# Patient Record
Sex: Male | Born: 1960 | Race: Black or African American | Hispanic: No | Marital: Married | Smoking: Never smoker
Health system: Southern US, Community
[De-identification: ages and names within clinical notes are randomized; demographics above are authoritative.]

## PROBLEM LIST (undated history)

## (undated) DIAGNOSIS — R519 Headache, unspecified: Secondary | ICD-10-CM

## (undated) DIAGNOSIS — Z21 Asymptomatic human immunodeficiency virus [HIV] infection status: Secondary | ICD-10-CM

## (undated) HISTORY — PX: EYE SURGERY: SHX253

---

## 1997-12-30 ENCOUNTER — Encounter (INDEPENDENT_AMBULATORY_CARE_PROVIDER_SITE_OTHER): Payer: Self-pay | Admitting: *Deleted

## 1997-12-30 ENCOUNTER — Encounter: Admission: RE | Admit: 1997-12-30 | Discharge: 1997-12-30 | Payer: Self-pay | Admitting: Infectious Diseases

## 1997-12-30 LAB — CONVERTED CEMR LAB
CD4 Count: 9 microliters
CD4 T Cell Abs: 9

## 1998-01-05 ENCOUNTER — Encounter: Admission: RE | Admit: 1998-01-05 | Discharge: 1998-01-05 | Payer: Self-pay | Admitting: Internal Medicine

## 1998-01-06 ENCOUNTER — Encounter: Admission: RE | Admit: 1998-01-06 | Discharge: 1998-01-06 | Payer: Self-pay | Admitting: Internal Medicine

## 1998-01-13 ENCOUNTER — Encounter: Admission: RE | Admit: 1998-01-13 | Discharge: 1998-01-13 | Payer: Self-pay | Admitting: Internal Medicine

## 1998-01-31 ENCOUNTER — Encounter: Admission: RE | Admit: 1998-01-31 | Discharge: 1998-01-31 | Payer: Self-pay | Admitting: Internal Medicine

## 1998-02-08 ENCOUNTER — Ambulatory Visit (HOSPITAL_COMMUNITY): Admission: RE | Admit: 1998-02-08 | Discharge: 1998-02-08 | Payer: Self-pay | Admitting: Internal Medicine

## 1998-03-02 ENCOUNTER — Encounter: Admission: RE | Admit: 1998-03-02 | Discharge: 1998-03-02 | Payer: Self-pay | Admitting: Internal Medicine

## 1998-03-02 ENCOUNTER — Encounter: Admission: RE | Admit: 1998-03-02 | Discharge: 1998-03-02 | Payer: Self-pay | Admitting: Infectious Diseases

## 1998-03-10 ENCOUNTER — Encounter: Admission: RE | Admit: 1998-03-10 | Discharge: 1998-03-10 | Payer: Self-pay | Admitting: Internal Medicine

## 1998-04-05 ENCOUNTER — Encounter: Admission: RE | Admit: 1998-04-05 | Discharge: 1998-04-05 | Payer: Self-pay | Admitting: Internal Medicine

## 1998-05-05 ENCOUNTER — Encounter: Admission: RE | Admit: 1998-05-05 | Discharge: 1998-05-05 | Payer: Self-pay | Admitting: Internal Medicine

## 1998-06-28 ENCOUNTER — Encounter: Admission: RE | Admit: 1998-06-28 | Discharge: 1998-06-28 | Payer: Self-pay | Admitting: Internal Medicine

## 1998-08-01 ENCOUNTER — Other Ambulatory Visit: Admission: RE | Admit: 1998-08-01 | Discharge: 1998-08-01 | Payer: Self-pay

## 1998-08-08 ENCOUNTER — Encounter: Admission: RE | Admit: 1998-08-08 | Discharge: 1998-08-08 | Payer: Self-pay | Admitting: Internal Medicine

## 1998-08-29 ENCOUNTER — Encounter: Admission: RE | Admit: 1998-08-29 | Discharge: 1998-08-29 | Payer: Self-pay | Admitting: Internal Medicine

## 1998-10-24 ENCOUNTER — Ambulatory Visit (HOSPITAL_COMMUNITY): Admission: RE | Admit: 1998-10-24 | Discharge: 1998-10-24 | Payer: Self-pay | Admitting: Internal Medicine

## 1998-11-21 ENCOUNTER — Encounter: Admission: RE | Admit: 1998-11-21 | Discharge: 1998-11-21 | Payer: Self-pay | Admitting: Internal Medicine

## 1999-01-23 ENCOUNTER — Ambulatory Visit (HOSPITAL_COMMUNITY): Admission: RE | Admit: 1999-01-23 | Discharge: 1999-01-23 | Payer: Self-pay | Admitting: Internal Medicine

## 1999-02-21 ENCOUNTER — Encounter: Admission: RE | Admit: 1999-02-21 | Discharge: 1999-02-21 | Payer: Self-pay | Admitting: Internal Medicine

## 1999-04-27 ENCOUNTER — Ambulatory Visit (HOSPITAL_COMMUNITY): Admission: RE | Admit: 1999-04-27 | Discharge: 1999-04-27 | Payer: Self-pay | Admitting: Internal Medicine

## 1999-04-27 ENCOUNTER — Encounter: Admission: RE | Admit: 1999-04-27 | Discharge: 1999-04-27 | Payer: Self-pay | Admitting: Internal Medicine

## 1999-05-29 ENCOUNTER — Encounter: Admission: RE | Admit: 1999-05-29 | Discharge: 1999-05-29 | Payer: Self-pay | Admitting: Internal Medicine

## 2000-03-01 ENCOUNTER — Encounter: Admission: RE | Admit: 2000-03-01 | Discharge: 2000-03-01 | Payer: Self-pay | Admitting: Internal Medicine

## 2000-03-01 ENCOUNTER — Ambulatory Visit (HOSPITAL_COMMUNITY): Admission: RE | Admit: 2000-03-01 | Discharge: 2000-03-01 | Payer: Self-pay | Admitting: Internal Medicine

## 2000-04-01 ENCOUNTER — Encounter: Admission: RE | Admit: 2000-04-01 | Discharge: 2000-04-01 | Payer: Self-pay | Admitting: Infectious Diseases

## 2000-04-01 ENCOUNTER — Ambulatory Visit (HOSPITAL_COMMUNITY): Admission: RE | Admit: 2000-04-01 | Discharge: 2000-04-01 | Payer: Self-pay | Admitting: Internal Medicine

## 2000-05-04 ENCOUNTER — Encounter: Payer: Self-pay | Admitting: Emergency Medicine

## 2000-05-04 ENCOUNTER — Emergency Department (HOSPITAL_COMMUNITY): Admission: EM | Admit: 2000-05-04 | Discharge: 2000-05-04 | Payer: Self-pay | Admitting: Emergency Medicine

## 2000-05-07 ENCOUNTER — Ambulatory Visit (HOSPITAL_COMMUNITY): Admission: RE | Admit: 2000-05-07 | Discharge: 2000-05-07 | Payer: Self-pay | Admitting: Internal Medicine

## 2000-05-07 ENCOUNTER — Encounter: Payer: Self-pay | Admitting: Internal Medicine

## 2000-05-07 ENCOUNTER — Encounter: Admission: RE | Admit: 2000-05-07 | Discharge: 2000-05-07 | Payer: Self-pay | Admitting: Internal Medicine

## 2000-05-22 ENCOUNTER — Encounter: Admission: RE | Admit: 2000-05-22 | Discharge: 2000-05-22 | Payer: Self-pay | Admitting: Internal Medicine

## 2001-02-25 ENCOUNTER — Ambulatory Visit (HOSPITAL_COMMUNITY): Admission: RE | Admit: 2001-02-25 | Discharge: 2001-02-25 | Payer: Self-pay | Admitting: Internal Medicine

## 2001-02-25 ENCOUNTER — Encounter: Admission: RE | Admit: 2001-02-25 | Discharge: 2001-02-25 | Payer: Self-pay | Admitting: Internal Medicine

## 2002-04-27 ENCOUNTER — Encounter: Admission: RE | Admit: 2002-04-27 | Discharge: 2002-04-27 | Payer: Self-pay | Admitting: Internal Medicine

## 2002-04-27 ENCOUNTER — Ambulatory Visit (HOSPITAL_COMMUNITY): Admission: RE | Admit: 2002-04-27 | Discharge: 2002-04-27 | Payer: Self-pay | Admitting: Internal Medicine

## 2002-10-20 ENCOUNTER — Encounter: Admission: RE | Admit: 2002-10-20 | Discharge: 2002-10-20 | Payer: Self-pay | Admitting: Internal Medicine

## 2003-07-29 ENCOUNTER — Ambulatory Visit (HOSPITAL_COMMUNITY): Admission: RE | Admit: 2003-07-29 | Discharge: 2003-07-29 | Payer: Self-pay | Admitting: Internal Medicine

## 2003-07-29 ENCOUNTER — Encounter: Admission: RE | Admit: 2003-07-29 | Discharge: 2003-07-29 | Payer: Self-pay | Admitting: Internal Medicine

## 2003-08-10 ENCOUNTER — Encounter: Admission: RE | Admit: 2003-08-10 | Discharge: 2003-08-10 | Payer: Self-pay | Admitting: Internal Medicine

## 2003-08-10 ENCOUNTER — Ambulatory Visit (HOSPITAL_COMMUNITY): Admission: RE | Admit: 2003-08-10 | Discharge: 2003-08-10 | Payer: Self-pay | Admitting: Internal Medicine

## 2003-11-11 ENCOUNTER — Ambulatory Visit (HOSPITAL_COMMUNITY): Admission: RE | Admit: 2003-11-11 | Discharge: 2003-11-11 | Payer: Self-pay | Admitting: Internal Medicine

## 2003-11-11 ENCOUNTER — Encounter: Admission: RE | Admit: 2003-11-11 | Discharge: 2003-11-11 | Payer: Self-pay | Admitting: Internal Medicine

## 2003-12-24 ENCOUNTER — Ambulatory Visit (HOSPITAL_COMMUNITY): Admission: RE | Admit: 2003-12-24 | Discharge: 2003-12-24 | Payer: Self-pay | Admitting: Surgery

## 2004-06-07 ENCOUNTER — Ambulatory Visit: Payer: Self-pay | Admitting: Internal Medicine

## 2004-06-07 ENCOUNTER — Ambulatory Visit (HOSPITAL_COMMUNITY): Admission: RE | Admit: 2004-06-07 | Discharge: 2004-06-07 | Payer: Self-pay | Admitting: Internal Medicine

## 2004-06-07 ENCOUNTER — Encounter (INDEPENDENT_AMBULATORY_CARE_PROVIDER_SITE_OTHER): Payer: Self-pay | Admitting: *Deleted

## 2004-06-07 LAB — CONVERTED CEMR LAB: CD4 Count: 320 microliters

## 2004-06-20 ENCOUNTER — Ambulatory Visit: Payer: Self-pay | Admitting: Internal Medicine

## 2005-10-12 ENCOUNTER — Ambulatory Visit: Payer: Self-pay | Admitting: Internal Medicine

## 2005-10-12 ENCOUNTER — Encounter (INDEPENDENT_AMBULATORY_CARE_PROVIDER_SITE_OTHER): Payer: Self-pay | Admitting: *Deleted

## 2005-10-12 ENCOUNTER — Encounter: Admission: RE | Admit: 2005-10-12 | Discharge: 2005-10-12 | Payer: Self-pay | Admitting: Internal Medicine

## 2005-10-12 LAB — CONVERTED CEMR LAB
CD4 Count: 450 microliters
HIV 1 RNA Quant: 399 copies/mL

## 2006-04-17 ENCOUNTER — Encounter (INDEPENDENT_AMBULATORY_CARE_PROVIDER_SITE_OTHER): Payer: Self-pay | Admitting: *Deleted

## 2006-04-17 ENCOUNTER — Ambulatory Visit: Payer: Self-pay | Admitting: Internal Medicine

## 2006-04-17 ENCOUNTER — Encounter: Admission: RE | Admit: 2006-04-17 | Discharge: 2006-04-17 | Payer: Self-pay | Admitting: Internal Medicine

## 2006-04-17 LAB — CONVERTED CEMR LAB
CD4 Count: 590 microliters
HIV 1 RNA Quant: 277 copies/mL

## 2006-10-03 DIAGNOSIS — Z87448 Personal history of other diseases of urinary system: Secondary | ICD-10-CM

## 2006-10-03 DIAGNOSIS — B20 Human immunodeficiency virus [HIV] disease: Secondary | ICD-10-CM | POA: Insufficient documentation

## 2006-10-03 DIAGNOSIS — F3289 Other specified depressive episodes: Secondary | ICD-10-CM | POA: Insufficient documentation

## 2006-10-03 DIAGNOSIS — Z8701 Personal history of pneumonia (recurrent): Secondary | ICD-10-CM

## 2006-10-03 DIAGNOSIS — F329 Major depressive disorder, single episode, unspecified: Secondary | ICD-10-CM

## 2006-10-03 DIAGNOSIS — Z8619 Personal history of other infectious and parasitic diseases: Secondary | ICD-10-CM

## 2006-10-03 DIAGNOSIS — C469 Kaposi's sarcoma, unspecified: Secondary | ICD-10-CM | POA: Insufficient documentation

## 2006-10-03 DIAGNOSIS — K219 Gastro-esophageal reflux disease without esophagitis: Secondary | ICD-10-CM | POA: Insufficient documentation

## 2006-10-03 DIAGNOSIS — I890 Lymphedema, not elsewhere classified: Secondary | ICD-10-CM | POA: Insufficient documentation

## 2006-10-03 DIAGNOSIS — Z947 Corneal transplant status: Secondary | ICD-10-CM | POA: Insufficient documentation

## 2006-10-03 DIAGNOSIS — H109 Unspecified conjunctivitis: Secondary | ICD-10-CM | POA: Insufficient documentation

## 2006-10-03 HISTORY — DX: Personal history of pneumonia (recurrent): Z87.01

## 2006-10-07 ENCOUNTER — Encounter (INDEPENDENT_AMBULATORY_CARE_PROVIDER_SITE_OTHER): Payer: Self-pay | Admitting: *Deleted

## 2006-10-07 LAB — CONVERTED CEMR LAB

## 2006-10-20 ENCOUNTER — Encounter (INDEPENDENT_AMBULATORY_CARE_PROVIDER_SITE_OTHER): Payer: Self-pay | Admitting: *Deleted

## 2006-10-31 ENCOUNTER — Telehealth: Payer: Self-pay | Admitting: Internal Medicine

## 2006-11-06 ENCOUNTER — Ambulatory Visit: Payer: Self-pay | Admitting: Internal Medicine

## 2006-11-06 ENCOUNTER — Encounter (INDEPENDENT_AMBULATORY_CARE_PROVIDER_SITE_OTHER): Payer: Self-pay | Admitting: *Deleted

## 2006-11-06 ENCOUNTER — Encounter: Admission: RE | Admit: 2006-11-06 | Discharge: 2006-11-06 | Payer: Self-pay | Admitting: Internal Medicine

## 2006-11-06 LAB — CONVERTED CEMR LAB
ALT: 20 units/L (ref 0–53)
Basophils Absolute: 0 10*3/uL (ref 0.0–0.1)
CO2: 27 meq/L (ref 19–32)
Calcium: 9.8 mg/dL (ref 8.4–10.5)
Chloride: 102 meq/L (ref 96–112)
Cholesterol: 192 mg/dL (ref 0–200)
Creatinine, Ser: 1.45 mg/dL (ref 0.40–1.50)
Eosinophils Relative: 2 % (ref 0–5)
Glucose, Bld: 89 mg/dL (ref 70–99)
HCT: 46.3 % (ref 39.0–52.0)
HIV 1 RNA Quant: 149 copies/mL — ABNORMAL HIGH (ref <50)
HIV-1 RNA Quant, Log: 2.17 — ABNORMAL HIGH (ref <1.70)
Hemoglobin: 15.8 g/dL (ref 13.0–17.0)
Lymphocytes Relative: 35 % (ref 12–46)
Lymphs Abs: 1.8 10*3/uL (ref 0.7–3.3)
Monocytes Absolute: 0.3 10*3/uL (ref 0.2–0.7)
Neutro Abs: 3 10*3/uL (ref 1.7–7.7)
RBC: 4.78 M/uL (ref 4.22–5.81)
RDW: 14.9 % — ABNORMAL HIGH (ref 11.5–14.0)
Total Bilirubin: 0.4 mg/dL (ref 0.3–1.2)
Total CHOL/HDL Ratio: 6
Total Protein: 7.5 g/dL (ref 6.0–8.3)
Triglycerides: 347 mg/dL — ABNORMAL HIGH (ref <150)
WBC: 5.2 10*3/uL (ref 4.0–10.5)

## 2006-11-26 ENCOUNTER — Telehealth: Payer: Self-pay | Admitting: Internal Medicine

## 2006-12-20 ENCOUNTER — Telehealth: Payer: Self-pay | Admitting: Internal Medicine

## 2007-01-24 ENCOUNTER — Telehealth: Payer: Self-pay | Admitting: Internal Medicine

## 2007-02-21 ENCOUNTER — Telehealth: Payer: Self-pay | Admitting: Internal Medicine

## 2007-03-21 ENCOUNTER — Telehealth: Payer: Self-pay | Admitting: Internal Medicine

## 2007-04-08 ENCOUNTER — Telehealth: Payer: Self-pay | Admitting: Internal Medicine

## 2007-04-25 ENCOUNTER — Telehealth: Payer: Self-pay | Admitting: Internal Medicine

## 2007-05-21 ENCOUNTER — Telehealth: Payer: Self-pay | Admitting: Internal Medicine

## 2007-06-25 ENCOUNTER — Telehealth: Payer: Self-pay | Admitting: Internal Medicine

## 2007-07-21 ENCOUNTER — Telehealth: Payer: Self-pay | Admitting: Internal Medicine

## 2007-07-23 ENCOUNTER — Telehealth: Payer: Self-pay | Admitting: Internal Medicine

## 2007-08-05 ENCOUNTER — Telehealth: Payer: Self-pay | Admitting: Internal Medicine

## 2007-08-12 ENCOUNTER — Encounter (INDEPENDENT_AMBULATORY_CARE_PROVIDER_SITE_OTHER): Payer: Self-pay | Admitting: *Deleted

## 2007-08-20 ENCOUNTER — Ambulatory Visit: Payer: Self-pay | Admitting: Internal Medicine

## 2007-08-20 ENCOUNTER — Telehealth: Payer: Self-pay | Admitting: Internal Medicine

## 2007-08-20 ENCOUNTER — Encounter: Admission: RE | Admit: 2007-08-20 | Discharge: 2007-08-20 | Payer: Self-pay | Admitting: Internal Medicine

## 2007-08-20 LAB — CONVERTED CEMR LAB
Albumin: 4.3 g/dL (ref 3.5–5.2)
BUN: 12 mg/dL (ref 6–23)
CO2: 25 meq/L (ref 19–32)
Calcium: 9.6 mg/dL (ref 8.4–10.5)
Chloride: 105 meq/L (ref 96–112)
Cholesterol: 157 mg/dL (ref 0–200)
Creatinine, Ser: 1.49 mg/dL (ref 0.40–1.50)
Glucose, Bld: 85 mg/dL (ref 70–99)
HDL: 31 mg/dL — ABNORMAL LOW (ref >39)
Hemoglobin: 14.3 g/dL (ref 13.0–17.0)
Lymphs Abs: 1.6 10*3/uL (ref 0.7–4.0)
Monocytes Absolute: 0.3 10*3/uL (ref 0.1–1.0)
Monocytes Relative: 11 % (ref 3–12)
Neutro Abs: 1 10*3/uL — ABNORMAL LOW (ref 1.7–7.7)
Neutrophils Relative %: 34 % — ABNORMAL LOW (ref 43–77)
Potassium: 4.2 meq/L (ref 3.5–5.3)
RBC: 4.89 M/uL (ref 4.22–5.81)
Triglycerides: 159 mg/dL — ABNORMAL HIGH (ref <150)
WBC: 3 10*3/uL — ABNORMAL LOW (ref 4.0–10.5)

## 2007-09-03 ENCOUNTER — Telehealth: Payer: Self-pay | Admitting: Internal Medicine

## 2007-09-03 ENCOUNTER — Ambulatory Visit: Payer: Self-pay | Admitting: Internal Medicine

## 2007-09-04 ENCOUNTER — Telehealth: Payer: Self-pay | Admitting: Internal Medicine

## 2007-10-07 ENCOUNTER — Telehealth (INDEPENDENT_AMBULATORY_CARE_PROVIDER_SITE_OTHER): Payer: Self-pay | Admitting: *Deleted

## 2007-10-16 ENCOUNTER — Ambulatory Visit: Payer: Self-pay | Admitting: Internal Medicine

## 2007-10-16 ENCOUNTER — Encounter: Admission: RE | Admit: 2007-10-16 | Discharge: 2007-10-16 | Payer: Self-pay | Admitting: Internal Medicine

## 2007-10-16 LAB — CONVERTED CEMR LAB
Albumin: 4.6 g/dL (ref 3.5–5.2)
BUN: 11 mg/dL (ref 6–23)
CO2: 25 meq/L (ref 19–32)
Cholesterol: 166 mg/dL (ref 0–200)
Glucose, Bld: 105 mg/dL — ABNORMAL HIGH (ref 70–99)
HIV-1 RNA Quant, Log: 3.04 — ABNORMAL HIGH (ref <1.70)
Hemoglobin: 14.9 g/dL (ref 13.0–17.0)
MCV: 91.9 fL (ref 78.0–100.0)
RBC: 4.82 M/uL (ref 4.22–5.81)
Sodium: 139 meq/L (ref 135–145)
Total Bilirubin: 0.5 mg/dL (ref 0.3–1.2)
Total Protein: 8.1 g/dL (ref 6.0–8.3)
Triglycerides: 274 mg/dL — ABNORMAL HIGH (ref <150)
VLDL: 55 mg/dL — ABNORMAL HIGH (ref 0–40)
WBC: 3.8 10*3/uL — ABNORMAL LOW (ref 4.0–10.5)

## 2007-10-23 ENCOUNTER — Telehealth: Payer: Self-pay | Admitting: Internal Medicine

## 2007-11-03 ENCOUNTER — Ambulatory Visit: Payer: Self-pay | Admitting: Internal Medicine

## 2007-11-03 DIAGNOSIS — K5909 Other constipation: Secondary | ICD-10-CM

## 2007-11-13 ENCOUNTER — Encounter (INDEPENDENT_AMBULATORY_CARE_PROVIDER_SITE_OTHER): Payer: Self-pay | Admitting: *Deleted

## 2007-11-19 ENCOUNTER — Telehealth (INDEPENDENT_AMBULATORY_CARE_PROVIDER_SITE_OTHER): Payer: Self-pay | Admitting: *Deleted

## 2007-12-18 ENCOUNTER — Telehealth (INDEPENDENT_AMBULATORY_CARE_PROVIDER_SITE_OTHER): Payer: Self-pay | Admitting: *Deleted

## 2008-01-19 ENCOUNTER — Telehealth (INDEPENDENT_AMBULATORY_CARE_PROVIDER_SITE_OTHER): Payer: Self-pay | Admitting: *Deleted

## 2008-02-20 ENCOUNTER — Telehealth (INDEPENDENT_AMBULATORY_CARE_PROVIDER_SITE_OTHER): Payer: Self-pay | Admitting: *Deleted

## 2008-02-25 ENCOUNTER — Ambulatory Visit: Payer: Self-pay | Admitting: Infectious Diseases

## 2008-02-25 ENCOUNTER — Encounter: Payer: Self-pay | Admitting: Internal Medicine

## 2008-02-25 ENCOUNTER — Encounter: Admission: RE | Admit: 2008-02-25 | Discharge: 2008-02-25 | Payer: Self-pay | Admitting: Internal Medicine

## 2008-02-25 ENCOUNTER — Encounter (INDEPENDENT_AMBULATORY_CARE_PROVIDER_SITE_OTHER): Payer: Self-pay | Admitting: *Deleted

## 2008-02-25 LAB — CONVERTED CEMR LAB
CO2: 22 meq/L (ref 19–32)
Creatinine, Ser: 1.45 mg/dL (ref 0.40–1.50)
Eosinophils Relative: 3 % (ref 0–5)
Glucose, Bld: 93 mg/dL (ref 70–99)
HCT: 43.5 % (ref 39.0–52.0)
HIV 1 RNA Quant: 896 copies/mL — ABNORMAL HIGH (ref <50)
HIV-1 RNA Quant, Log: 2.95 — ABNORMAL HIGH (ref <1.70)
Hemoglobin: 15.1 g/dL (ref 13.0–17.0)
Lymphocytes Relative: 48 % — ABNORMAL HIGH (ref 12–46)
Lymphs Abs: 2.3 10*3/uL (ref 0.7–4.0)
Monocytes Absolute: 0.3 10*3/uL (ref 0.1–1.0)
Total Bilirubin: 0.5 mg/dL (ref 0.3–1.2)
WBC: 4.9 10*3/uL (ref 4.0–10.5)

## 2008-03-10 ENCOUNTER — Ambulatory Visit: Payer: Self-pay | Admitting: Internal Medicine

## 2008-03-10 DIAGNOSIS — M549 Dorsalgia, unspecified: Secondary | ICD-10-CM | POA: Insufficient documentation

## 2008-03-17 ENCOUNTER — Telehealth (INDEPENDENT_AMBULATORY_CARE_PROVIDER_SITE_OTHER): Payer: Self-pay | Admitting: *Deleted

## 2008-04-14 ENCOUNTER — Telehealth (INDEPENDENT_AMBULATORY_CARE_PROVIDER_SITE_OTHER): Payer: Self-pay | Admitting: *Deleted

## 2008-06-07 ENCOUNTER — Ambulatory Visit: Payer: Self-pay | Admitting: Internal Medicine

## 2008-06-07 LAB — CONVERTED CEMR LAB: HIV-1 RNA Quant, Log: 2.48 — ABNORMAL HIGH (ref <1.70)

## 2008-07-23 ENCOUNTER — Ambulatory Visit: Payer: Self-pay | Admitting: Internal Medicine

## 2008-07-23 LAB — CONVERTED CEMR LAB
ALT: 18 units/L (ref 0–53)
AST: 18 units/L (ref 0–37)
Albumin: 4.9 g/dL (ref 3.5–5.2)
Alkaline Phosphatase: 50 units/L (ref 39–117)
Basophils Absolute: 0 10*3/uL (ref 0.0–0.1)
Eosinophils Absolute: 0 10*3/uL (ref 0.0–0.7)
Glucose, Bld: 93 mg/dL (ref 70–99)
HIV-1 RNA Quant, Log: 2.72 — ABNORMAL HIGH (ref <1.68)
Lymphocytes Relative: 49 % — ABNORMAL HIGH (ref 12–46)
Lymphs Abs: 1.6 10*3/uL (ref 0.7–4.0)
MCV: 102.7 fL — ABNORMAL HIGH (ref 78.0–100.0)
Neutrophils Relative %: 42 % — ABNORMAL LOW (ref 43–77)
Platelets: 197 10*3/uL (ref 150–400)
Potassium: 4.4 meq/L (ref 3.5–5.3)
Sodium: 141 meq/L (ref 135–145)
Total Protein: 7.8 g/dL (ref 6.0–8.3)
WBC: 3.4 10*3/uL — ABNORMAL LOW (ref 4.0–10.5)

## 2008-08-03 ENCOUNTER — Ambulatory Visit: Payer: Self-pay | Admitting: Internal Medicine

## 2008-08-11 ENCOUNTER — Telehealth (INDEPENDENT_AMBULATORY_CARE_PROVIDER_SITE_OTHER): Payer: Self-pay | Admitting: *Deleted

## 2008-09-10 ENCOUNTER — Telehealth (INDEPENDENT_AMBULATORY_CARE_PROVIDER_SITE_OTHER): Payer: Self-pay | Admitting: *Deleted

## 2008-10-07 ENCOUNTER — Telehealth (INDEPENDENT_AMBULATORY_CARE_PROVIDER_SITE_OTHER): Payer: Self-pay | Admitting: *Deleted

## 2008-10-20 ENCOUNTER — Ambulatory Visit: Payer: Self-pay | Admitting: Internal Medicine

## 2008-10-20 LAB — CONVERTED CEMR LAB
Alkaline Phosphatase: 59 units/L (ref 39–117)
BUN: 17 mg/dL (ref 6–23)
Basophils Absolute: 0 10*3/uL (ref 0.0–0.1)
Basophils Relative: 0 % (ref 0–1)
CO2: 24 meq/L (ref 19–32)
Cholesterol: 184 mg/dL (ref 0–200)
Eosinophils Absolute: 0.1 10*3/uL (ref 0.0–0.7)
Eosinophils Relative: 1 % (ref 0–5)
Glucose, Bld: 78 mg/dL (ref 70–99)
HCT: 44.8 % (ref 39.0–52.0)
HDL: 33 mg/dL — ABNORMAL LOW (ref >39)
HIV-1 RNA Quant, Log: 2.49 — ABNORMAL HIGH (ref <1.68)
LDL Cholesterol: 108 mg/dL — ABNORMAL HIGH (ref 0–99)
MCHC: 35 g/dL (ref 30.0–36.0)
MCV: 103.2 fL — ABNORMAL HIGH (ref 78.0–100.0)
Monocytes Absolute: 0.5 10*3/uL (ref 0.1–1.0)
Monocytes Relative: 8 % (ref 3–12)
Platelets: 190 10*3/uL (ref 150–400)
RDW: 13.3 % (ref 11.5–15.5)
Total Bilirubin: 0.5 mg/dL (ref 0.3–1.2)
Triglycerides: 215 mg/dL — ABNORMAL HIGH (ref <150)
VLDL: 43 mg/dL — ABNORMAL HIGH (ref 0–40)

## 2008-11-03 ENCOUNTER — Telehealth (INDEPENDENT_AMBULATORY_CARE_PROVIDER_SITE_OTHER): Payer: Self-pay | Admitting: *Deleted

## 2008-11-09 ENCOUNTER — Ambulatory Visit: Payer: Self-pay | Admitting: Internal Medicine

## 2008-11-23 ENCOUNTER — Encounter (INDEPENDENT_AMBULATORY_CARE_PROVIDER_SITE_OTHER): Payer: Self-pay | Admitting: *Deleted

## 2008-12-03 ENCOUNTER — Telehealth (INDEPENDENT_AMBULATORY_CARE_PROVIDER_SITE_OTHER): Payer: Self-pay | Admitting: *Deleted

## 2008-12-27 ENCOUNTER — Telehealth: Payer: Self-pay | Admitting: Internal Medicine

## 2009-01-05 ENCOUNTER — Telehealth (INDEPENDENT_AMBULATORY_CARE_PROVIDER_SITE_OTHER): Payer: Self-pay | Admitting: *Deleted

## 2009-02-08 ENCOUNTER — Telehealth (INDEPENDENT_AMBULATORY_CARE_PROVIDER_SITE_OTHER): Payer: Self-pay | Admitting: *Deleted

## 2009-03-01 ENCOUNTER — Telehealth (INDEPENDENT_AMBULATORY_CARE_PROVIDER_SITE_OTHER): Payer: Self-pay | Admitting: *Deleted

## 2009-03-31 ENCOUNTER — Telehealth (INDEPENDENT_AMBULATORY_CARE_PROVIDER_SITE_OTHER): Payer: Self-pay | Admitting: *Deleted

## 2009-04-29 ENCOUNTER — Telehealth (INDEPENDENT_AMBULATORY_CARE_PROVIDER_SITE_OTHER): Payer: Self-pay | Admitting: *Deleted

## 2009-05-30 ENCOUNTER — Ambulatory Visit: Payer: Self-pay | Admitting: Internal Medicine

## 2009-05-30 ENCOUNTER — Encounter (INDEPENDENT_AMBULATORY_CARE_PROVIDER_SITE_OTHER): Payer: Self-pay | Admitting: *Deleted

## 2009-06-14 ENCOUNTER — Ambulatory Visit: Payer: Self-pay | Admitting: Internal Medicine

## 2009-06-14 DIAGNOSIS — N529 Male erectile dysfunction, unspecified: Secondary | ICD-10-CM | POA: Insufficient documentation

## 2009-06-14 LAB — CONVERTED CEMR LAB: Testosterone: 653.13 ng/dL (ref 350–890)

## 2009-06-22 ENCOUNTER — Telehealth (INDEPENDENT_AMBULATORY_CARE_PROVIDER_SITE_OTHER): Payer: Self-pay | Admitting: *Deleted

## 2009-06-22 ENCOUNTER — Telehealth: Payer: Self-pay | Admitting: Internal Medicine

## 2009-07-04 ENCOUNTER — Telehealth (INDEPENDENT_AMBULATORY_CARE_PROVIDER_SITE_OTHER): Payer: Self-pay | Admitting: *Deleted

## 2009-07-11 ENCOUNTER — Encounter: Payer: Self-pay | Admitting: Internal Medicine

## 2009-07-21 ENCOUNTER — Telehealth (INDEPENDENT_AMBULATORY_CARE_PROVIDER_SITE_OTHER): Payer: Self-pay | Admitting: *Deleted

## 2009-07-28 ENCOUNTER — Telehealth (INDEPENDENT_AMBULATORY_CARE_PROVIDER_SITE_OTHER): Payer: Self-pay | Admitting: *Deleted

## 2009-08-25 ENCOUNTER — Telehealth (INDEPENDENT_AMBULATORY_CARE_PROVIDER_SITE_OTHER): Payer: Self-pay | Admitting: *Deleted

## 2009-09-27 ENCOUNTER — Telehealth (INDEPENDENT_AMBULATORY_CARE_PROVIDER_SITE_OTHER): Payer: Self-pay | Admitting: *Deleted

## 2009-11-01 ENCOUNTER — Encounter (INDEPENDENT_AMBULATORY_CARE_PROVIDER_SITE_OTHER): Payer: Self-pay | Admitting: *Deleted

## 2009-11-01 ENCOUNTER — Telehealth (INDEPENDENT_AMBULATORY_CARE_PROVIDER_SITE_OTHER): Payer: Self-pay | Admitting: *Deleted

## 2009-11-21 ENCOUNTER — Telehealth (INDEPENDENT_AMBULATORY_CARE_PROVIDER_SITE_OTHER): Payer: Self-pay | Admitting: *Deleted

## 2009-12-05 ENCOUNTER — Telehealth (INDEPENDENT_AMBULATORY_CARE_PROVIDER_SITE_OTHER): Payer: Self-pay | Admitting: *Deleted

## 2009-12-16 ENCOUNTER — Telehealth (INDEPENDENT_AMBULATORY_CARE_PROVIDER_SITE_OTHER): Payer: Self-pay | Admitting: *Deleted

## 2009-12-19 ENCOUNTER — Telehealth (INDEPENDENT_AMBULATORY_CARE_PROVIDER_SITE_OTHER): Payer: Self-pay | Admitting: *Deleted

## 2009-12-27 ENCOUNTER — Ambulatory Visit: Payer: Self-pay | Admitting: Internal Medicine

## 2009-12-27 DIAGNOSIS — IMO0002 Reserved for concepts with insufficient information to code with codable children: Secondary | ICD-10-CM

## 2009-12-27 DIAGNOSIS — K921 Melena: Secondary | ICD-10-CM | POA: Insufficient documentation

## 2009-12-27 LAB — CONVERTED CEMR LAB
ALT: 13 units/L (ref 0–53)
AST: 21 units/L (ref 0–37)
Albumin: 4.5 g/dL (ref 3.5–5.2)
CO2: 24 meq/L (ref 19–32)
Calcium: 9.9 mg/dL (ref 8.4–10.5)
Chloride: 106 meq/L (ref 96–112)
Cholesterol: 195 mg/dL (ref 0–200)
Creatinine, Ser: 1.5 mg/dL (ref 0.40–1.50)
Eosinophils Absolute: 0.1 10*3/uL (ref 0.0–0.7)
HCT: 44.4 % (ref 39.0–52.0)
HIV 1 RNA Quant: <48 copies/mL (ref <48)
HIV-1 RNA Quant, Log: <1.68 (ref <1.68)
Lymphocytes Relative: 52 % — ABNORMAL HIGH (ref 12–46)
Lymphs Abs: 2.2 10*3/uL (ref 0.7–4.0)
MCV: 102.8 fL — ABNORMAL HIGH (ref 78.0–100.0)
Monocytes Relative: 6 % (ref 3–12)
Neutrophils Relative %: 39 % — ABNORMAL LOW (ref 43–77)
Platelets: 179 10*3/uL (ref 150–400)
Potassium: 4.2 meq/L (ref 3.5–5.3)
RBC: 4.32 M/uL (ref 4.22–5.81)
Total CHOL/HDL Ratio: 6.5
Total Protein: 7.2 g/dL (ref 6.0–8.3)
WBC: 4.3 10*3/uL (ref 4.0–10.5)

## 2010-01-05 ENCOUNTER — Ambulatory Visit: Payer: Self-pay | Admitting: Internal Medicine

## 2010-01-10 ENCOUNTER — Ambulatory Visit: Payer: Self-pay | Admitting: Internal Medicine

## 2010-01-10 LAB — CONVERTED CEMR LAB
OCCULT 1: NEGATIVE
OCCULT 2: NEGATIVE
OCCULT 3: NEGATIVE

## 2010-03-01 ENCOUNTER — Telehealth (INDEPENDENT_AMBULATORY_CARE_PROVIDER_SITE_OTHER): Payer: Self-pay | Admitting: *Deleted

## 2010-03-27 ENCOUNTER — Telehealth (INDEPENDENT_AMBULATORY_CARE_PROVIDER_SITE_OTHER): Payer: Self-pay | Admitting: *Deleted

## 2010-07-11 ENCOUNTER — Encounter (INDEPENDENT_AMBULATORY_CARE_PROVIDER_SITE_OTHER): Payer: Self-pay | Admitting: *Deleted

## 2010-09-10 LAB — CONVERTED CEMR LAB: HIV 1 RNA Quant: 245 copies/mL — ABNORMAL HIGH (ref <48)

## 2010-09-14 NOTE — Miscellaneous (Signed)
  Clinical Lists Changes  Observations: Added new observation of YEARAIDSPOS: 1999  (07/11/2010 11:25)

## 2010-09-14 NOTE — Progress Notes (Signed)
Summary: NCADAP/pt assist med arrived for Mar  Phone Note Refill Request      Prescriptions: VIRAMUNE 200 MG TABS (NEVIRAPINE) Take 1 tablet by mouth two times a day  #60 x 0   Entered by:   Paulo Fruit  BS,CPht II,MPH   Authorized by:   Cliffton Asters MD   Signed by:   Paulo Fruit  BS,CPht II,MPH on 11/01/2009   Method used:   Samples Given   RxID:   [id number] COMBIVIR 150-300 MG TABS (LAMIVUDINE-ZIDOVUDINE) Take 1 tablet by mouth two times a day  #60 x 0   Entered by:   Paulo Fruit  BS,CPht II,MPH   Authorized by:   Cliffton Asters MD   Signed by:   Paulo Fruit  BS,CPht II,MPH on 11/01/2009   Method used:   Samples Given   RxID:   [id number]   Patient Assist Medication Verification: Medication: Combivir 150mg /300mg  QIO#[id number] Exp Date:Jul 2014 Tech approval:MLD                Patient Assist Medication Verification: Medication: Viramune 200mg  UXL#[id number] Exp Date:Nov 13 Tech approval:MLD Call placed to patient with message that assistance medications are ready for pick-up. Left message Paulo Fruit  BS,CPht II,MPH  November 01, 2009 2:13 PM

## 2010-09-14 NOTE — Progress Notes (Signed)
Summary: ncadap/pt assist med arrived for apr  Phone Note Refill Request      Prescriptions: VIRAMUNE 200 MG TABS (NEVIRAPINE) Take 1 tablet by mouth two times a day  #60 x 0   Entered by:   Paulo Fruit  BS,CPht II,MPH   Authorized by:   Cliffton Asters MD   Signed by:   Paulo Fruit  BS,CPht II,MPH on 11/21/2009   Method used:   Samples Given   RxID:   [id number] COMBIVIR 150-300 MG TABS (LAMIVUDINE-ZIDOVUDINE) Take 1 tablet by mouth two times a day  #60 x 0   Entered by:   Paulo Fruit  BS,CPht II,MPH   Authorized by:   Cliffton Asters MD   Signed by:   Paulo Fruit  BS,CPht II,MPH on 11/21/2009   Method used:   Samples Given   RxID:   [id number]   Patient Assist Medication Verification: Medication: Viramune 200mg  Lot# [id number] Exp Date:Dec 13 Tech approval:MLD                Patient Assist Medication Verification: Medication:Combivir 150mg /300mg  OZD#[id number] Exp Date:Oct 2014 Tech approval:MLD Call placed to patient with message that assistance medications are ready for pick-up. Paulo Fruit  BS,CPht II,MPH  November 21, 2009 3:32 PM                  ]

## 2010-09-14 NOTE — Assessment & Plan Note (Signed)
Summary: F/U/VS   CC:  follow-up visit.  History of Present Illness: Billy [name] is in for his routine visit. He missed his appointment for his lab work.  He continues taking Combivir and Viramune but has missed some doses since his last visit.  He estimates that he has missed about 4 times in the last few months, usually in the morning dose.  He says this has been a cause he has developed left lower back pain over the last two months and sometimes has so much pain when he first gets up in the morning that he forgets to take his medication. The pain began gradually and he does not remember any inciting injury.  He has had some episodes of similar back pain in the past but never this severe.  The pain does not radiate and is not associated with any weakness or numbness in his leg.  He has had no problems with urination.  He estimates the pain is about 7 or 8 on a scale of one to 10.  He also had an episode of bright red blood in his stool about two months ago.  He suffers from chronic constipation but says that his bowel movements were not painful.  The bleeding seemed to have stopped after about 7 to 10 days.  He tried taking Viagra after his last visit but had flushing and headaches on the two occasions he tried it.  It also did not help his erectile dysfunction so he stopped taking it.  Preventive Screening-Counseling & Management  Alcohol-Tobacco     Alcohol drinks/day: <1     Alcohol type: wine     Smoking Status: never  Caffeine-Diet-Exercise     Caffeine use/day: rarely     Does Patient Exercise: no     Type of exercise: weight-lifting     Exercise (avg: min/session): 30-60     Times/week: 3  Hep-HIV-STD-Contraception     HIV Risk: no risk noted     HIV Risk Counseling: not indicated-no HIV risk noted     Sun Exposure-Excessive: occasionally  Safety-Violence-Falls     Seat Belt Use: yes      Sexual History:  yes.        Drug Use:  never.     Prior Medication List:    COMBIVIR 150-300 MG TABS (LAMIVUDINE-ZIDOVUDINE) Take 1 tablet by mouth two times a day VIRAMUNE 200 MG TABS (NEVIRAPINE) Take 1 tablet by mouth two times a day VIAGRA 50 MG TABS (SILDENAFIL CITRATE) Take 1/2 tablet as directed   Current Allergies (reviewed today): No known allergies  Social History: Sexual History:  yes  Vital Signs:  Patient profile:   50 year old male Height:      73.2 inches (185.93 cm) Weight:      207 pounds (94.09 kg) Temp:     97.7 degrees F (36.50 degrees C) oral Pulse rate:   56 / minute BP sitting:   196 / 93  (left arm) Cuff size:   large  Vitals Entered By: Jennet Maduro RN (Dec 27, 2009 9:00 AM) CC: follow-up visit Is Patient Diabetic? No Pain Assessment Patient in pain? yes     Location: lower left back Intensity: 3 Type: aching Onset of pain  Chronic Nutritional Status BMI of 25 - 29 = overweight Nutritional Status Detail apptite "good"  Have you ever been in a relationship where you felt threatened, hurt or afraid?No   Does patient need assistance? Functional Status Self care Ambulation Normal Comments  4-5 missed doses   Physical Exam  General:  alert and well-nourished.   Mouth:  good dentition and pharynx pink and moist.   Lungs:  normal breath sounds.  no crackles and no wheezes.   Heart:  normal rate, regular rhythm, and no murmur.   Abdomen:  soft, non-tender, normal bowel sounds, and no distention.   Rectal:  no external abnormalities.   Msk:  no tenderness to palpation over his lower back. Neurologic:  strength normal in all extremities, sensation intact to light touch, gait normal, and DTRs symmetrical and normal.   Skin:  no rashes.   Psych:  normally interactive, good eye contact, not anxious appearing, and not depressed appearing.           Medication Adherence: 12/27/2009   Adherence to medications reviewed with patient. Counseling to provide adequate adherence provided   Prevention For Positives: 12/27/2009    Safe sex practices discussed with patient. Condoms offered.                             Impression & Recommendations:  Problem # 1:  HIV DISEASE (ICD-042) I have encouraged him to try not to miss a single dose of his medications.  I will check a full set of lab work today. Diagnostics Reviewed:  HIV: CDC-defined AIDS (03/10/2008)   CD4: 310 (05/31/2009)   WBC: 5.5 (10/20/2008)   PMN (bands): 0 (10/20/2008)   Hgb: 15.7 (10/20/2008)   HCT: 44.8 (10/20/2008)   Platelets: 190 (10/20/2008) HIV-1 RNA: 245 (05/30/2009)   HBSAg: No (10/07/2006)  Problem # 2:  HYPERTENSION NEC (ICD-997.91) His blood pressure is elevated today.  This may be due to his pain.  Will have him back in two weeks to check this. Orders: Est. Patient Level IV (95638)  Problem # 3:  HEMATOCHEZIA (ICD-578.1) I will check hemoccult and consider GI referral. Orders: Est. Patient Level IV (75643) T-Hemoccult Card-Multiple (take home) (32951)  Problem # 4:  BACK PAIN, ACUTE (ICD-724.5)  he does not have insurance and would struggle with the financial strain of an MRI scan.  He probably has low back strain.  I will treat him with Naprosyn and Flexeril and see him back in two weeks. His updated medication list for this problem includes:    Naprosyn 500 Mg Tabs (Naproxen) .Marland Kitchen... Take 1 tablet by mouth two times a day    Flexeril 5 Mg Tabs (Cyclobenzaprine hcl) .Marland Kitchen... Take 1 tablet by mouth three times a day  Orders: Est. Patient Level IV (88416)  Problem # 5:  ERECTILE DYSFUNCTION, ORGANIC (ICD-607.84) I have suggested that he try yohimbe, and over-the-counter supplement that has been of benefit in some in with performance anxiety to see if this helps. The following medications were removed from the medication list:    Viagra 50 Mg Tabs (Sildenafil citrate) .Marland Kitchen... Take 1/2 tablet as directed  Orders: Est. Patient Level IV (60630)  Medications Added to Medication List This Visit: 1)  Yohimbe 500 Mg Caps (Yohimbe  bark) .... Take 1 capsule by mouth once a day 2)  Naprosyn 500 Mg Tabs (Naproxen) .... Take 1 tablet by mouth two times a day 3)  Flexeril 5 Mg Tabs (Cyclobenzaprine hcl) .... Take 1 tablet by mouth three times a day  Other Orders: T-CD4SP [facility]) (CD4SP) T-HIV Viral Load [phone]) T-Comprehensive Metabolic Panel [phone]) T-Lipid Profile [phone]) T-CBC w/Diff [id number]) T-RPR (Syphilis) (71062-69485)  Patient Instructions: 1)  Please schedule  a follow-up appointment on May 26. Prescriptions: FLEXERIL 5 MG TABS (CYCLOBENZAPRINE HCL) Take 1 tablet by mouth three times a day  #60 x 0   Entered and Authorized by:   Cliffton Asters MD   Signed by:   Cliffton Asters MD on 12/27/2009   Method used:   Print then Give to Patient   RxID:   [id number]

## 2010-09-14 NOTE — Progress Notes (Signed)
Summary: NCADAP/pt assist meds arrived for Feb  Phone Note Refill Request      Prescriptions: VIRAMUNE 200 MG TABS (NEVIRAPINE) Take 1 tablet by mouth two times a day  #60 x 0   Entered by:   Paulo Fruit  BS,CPht II,MPH   Authorized by:   Cliffton Asters MD   Signed by:   Paulo Fruit  BS,CPht II,MPH on 09/27/2009   Method used:   Samples Given   RxID:   [id number] COMBIVIR 150-300 MG TABS (LAMIVUDINE-ZIDOVUDINE) Take 1 tablet by mouth two times a day  #60 x 0   Entered by:   Paulo Fruit  BS,CPht II,MPH   Authorized by:   Cliffton Asters MD   Signed by:   Paulo Fruit  BS,CPht II,MPH on 09/27/2009   Method used:   Samples Given   RxID:   [id number]   Patient Assist Medication Verification: Medication: Viramune 200mg  QIO#[id number] Exp Date:sep 13 Tech approval:MLD                Patient Assist Medication Verification: Medication:Combivir 150/300mg  WUX#[id number] Exp Date:Oct 2014 Tech approval:MLD **Patient called earlier and is aware that medications are ready for pickup** Paulo Fruit  BS,CPht II,MPH  September 27, 2009 4:53 PM

## 2010-09-14 NOTE — Progress Notes (Signed)
Summary: pt assist med arrived via [employer] PAP for 3 months supply  Phone Note Refill Request      Prescriptions: VIAGRA 50 MG TABS (SILDENAFIL CITRATE) Take 1/2 tablet as directed  #30 x 0   Entered by:   Paulo Fruit  BS,CPht II,MPH   Authorized by:   Cliffton Asters MD   Signed by:   Paulo Fruit  BS,CPht II,MPH on 12/16/2009   Method used:   Samples Given   RxID:   [id number]   Patient Assist Medication Verification: Medication: Viagra 50mg  Lot# [id number] Exp Date:01 Nov 15 Tech approval:MLD Call placed to patient with message that assistance medications are ready for pick-up. left message for patient to contact [facility] @ [phone] Paulo Fruit  BS,CPht II,MPH  Dec 16, 2009 3:31 PM

## 2010-09-14 NOTE — Progress Notes (Signed)
Summary: NCADAP/pt assist meds arrived for Jan  Phone Note Refill Request      Prescriptions: VIRAMUNE 200 MG TABS (NEVIRAPINE) Take 1 tablet by mouth two times a day  #60 x 0   Entered by:   Paulo Fruit  BS,CPht II,MPH   Authorized by:   Cliffton Asters MD   Signed by:   Paulo Fruit  BS,CPht II,MPH on 08/25/2009   Method used:   Samples Given   RxID:   [id number] COMBIVIR 150-300 MG TABS (LAMIVUDINE-ZIDOVUDINE) Take 1 tablet by mouth two times a day  #60 x 0   Entered by:   Paulo Fruit  BS,CPht II,MPH   Authorized by:   Cliffton Asters MD   Signed by:   Paulo Fruit  BS,CPht II,MPH on 08/25/2009   Method used:   Samples Given   RxID:   [id number]   Patient Assist Medication Verification: Medication: Viramune 200mg  QIO#[id number] Exp Date:Sep 13 Tech approval:MLD                Patient Assist Medication Verification: Medication:Combivir 150/300mg  WUX#[id number] Exp Date:Jul 2014 Tech approval:MLD  **Patient came to pick up December's order this afternoon** Paulo Fruit  BS,CPht II,MPH  August 25, 2009 4:15 PM

## 2010-09-14 NOTE — Miscellaneous (Signed)
Summary: clinical update/ryan white NCADAP app til 11/11/10  Clinical Lists Changes  Observations: Added new observation of AIDSDAP: Yes 2011 (11/01/2009 12:11)

## 2010-09-14 NOTE — Progress Notes (Signed)
Summary: NCADAP/pt assist meds arrived for Jul  Phone Note Refill Request      Prescriptions: VIRAMUNE 200 MG TABS (NEVIRAPINE) Take 1 tablet by mouth two times a day  #60 x 0   Entered by:   Paulo Fruit  BS,CPht II,MPH   Authorized by:   Cliffton Asters MD   Signed by:   Paulo Fruit  BS,CPht II,MPH on 03/01/2010   Method used:   Samples Given   RxID:   [id number] COMBIVIR 150-300 MG TABS (LAMIVUDINE-ZIDOVUDINE) Take 1 tablet by mouth two times a day  #60 x 0   Entered by:   Paulo Fruit  BS,CPht II,MPH   Authorized by:   Cliffton Asters MD   Signed by:   Paulo Fruit  BS,CPht II,MPH on 03/01/2010   Method used:   Samples Given   RxID:   [id number]  Patient Assist Medication Verification: Medication name: Viramune 200mg  RX # [id number] Tech approval:MLD  Patient Assist Medication Verification: Medication name:Combivir 150/300mg  RX # [id number] Tech approval:MLD Call placed to patient with message that assistance medications are ready for pick-up. Paulo Fruit  BS,CPht II,MPH  March 01, 2010 2:27 PM

## 2010-09-14 NOTE — Progress Notes (Signed)
Summary: Refill reorder of medication via Patient assistance program  Refill reorder of patient's Viagra via [employer] patient assistance program for the next 3 months.  It will arrive to our clinic withing 7-10 days. The confirmation number is [id number] Billy [name]  BS,CPht II,MPH  December 05, 2009 2:29 PM

## 2010-09-14 NOTE — Progress Notes (Signed)
Summary: Pharmacy unable to reach pt to deliver medications for August  Phone Note Outgoing Call   Call placed by: Paulo Fruit  BS,CPht II,MPH,  March 27, 2010 10:20 AM Call placed to: Patient Details for Reason: [facility] cannot reach patient to deliver medications forAugust Summary of Call: Tried to contact patient to give him the message.  Unable to leave a message at the time call was placed because patient's voicemailbox to his cell phone was full an unable to accept messages until cleared. Initial call taken by: Paulo Fruit  BS,CPht II,MPH,  March 27, 2010 10:21 AM Caller: [facility] Summary of Call: Received an email from [facility] stating that they are unable to reach patient.  They said last month when they spoke to patient, he wanted his medications to be delivered to his home.  In order for them to do this they need to speak with him to verify address and other information before they ship to him. Initial call taken by: Paulo Fruit  BS,CPht II,MPH,  March 27, 2010 10:20 AM

## 2010-09-14 NOTE — Progress Notes (Signed)
Summary: NCADAP/pt assist meds arrived for May  Phone Note Refill Request      Prescriptions: VIRAMUNE 200 MG TABS (NEVIRAPINE) Take 1 tablet by mouth two times a day  #60 x 0   Entered by:   Paulo Fruit  BS,CPht II,MPH   Authorized by:   Cliffton Asters MD   Signed by:   Paulo Fruit  BS,CPht II,MPH on 12/19/2009   Method used:   Samples Given   RxID:   [id number] COMBIVIR 150-300 MG TABS (LAMIVUDINE-ZIDOVUDINE) Take 1 tablet by mouth two times a day  #60 x 0   Entered by:   Paulo Fruit  BS,CPht II,MPH   Authorized by:   Cliffton Asters MD   Signed by:   Paulo Fruit  BS,CPht II,MPH on 12/19/2009   Method used:   Samples Given   RxID:   [id number]   Patient Assist Medication Verification: Medication: combivir 150mg /300mg  NUU#[id number] Exp Date:Oct 2014 Tech approval:MLD                Patient Assist Medication Verification: Medication:Viramune 200mg  Lot# [id number] Exp Date:Dec 2013 Tech approval:MLD ** patient has additonal medication via another PAP waiting to be picked up.  Already left message** Paulo Fruit  BS,CPht II,MPH  Dec 19, 2009 11:52 AM

## 2010-09-14 NOTE — Assessment & Plan Note (Signed)
Summary: F/U OV/VS   CC:  follow-up visit.  History of Present Illness: Billy [name] is in for his routine follow-up visit.  He continues to have low back pain but says that he did not go to the pharmacy and get any Naprosyn or fill his Flexeril prescription after his visit last week.  He says he was uncertain what those medications were for.  He did try the yohimbe and found it to be somewhat helpful for his erectile dysfunction.  He has not missed any doses of his HIV medications.  He is not noted any blood in his stool and says he sent the Hemoccult cards back in.  Preventive Screening-Counseling & Management  Alcohol-Tobacco     Alcohol drinks/day: <1     Alcohol type: wine     Smoking Status: never  Caffeine-Diet-Exercise     Caffeine use/day: rarely     Does Patient Exercise: no     Type of exercise: weight-lifting     Exercise (avg: min/session): 30-60     Times/week: 3  Hep-HIV-STD-Contraception     HIV Risk: no risk noted     HIV Risk Counseling: not indicated-no HIV risk noted     Sun Exposure-Excessive: occasionally  Safety-Violence-Falls     Seat Belt Use: yes  Comments: declined condoms   Prior Medication List:  COMBIVIR 150-300 MG TABS (LAMIVUDINE-ZIDOVUDINE) Take 1 tablet by mouth two times a day VIRAMUNE 200 MG TABS (NEVIRAPINE) Take 1 tablet by mouth two times a day YOHIMBE 500 MG CAPS (YOHIMBE BARK) Take 1 capsule by mouth once a day NAPROSYN 500 MG TABS (NAPROXEN) Take 1 tablet by mouth two times a day FLEXERIL 5 MG TABS (CYCLOBENZAPRINE HCL) Take 1 tablet by mouth three times a day   Updated Prior Medication List: he has not started naprosyn or Flexeril yet Current Allergies (reviewed today): No known allergies  Vital Signs:  Patient profile:   50 year old male Height:      73.2 inches (185.93 cm) Weight:      206.75 pounds (93.98 kg) BMI:     27.23 Temp:     98.3 degrees F (36.83 degrees C) oral Pulse rate:   58 / minute BP sitting:   134 / 88  (left  arm) Cuff size:   large  Vitals Entered By: Jennet Maduro RN (Jan 05, 2010 9:19 AM) CC: follow-up visit Is Patient Diabetic? No Pain Assessment Patient in pain? yes     Location: lower back  Intensity: 7 Type: sharp Onset of pain  Constant  Have you ever been in a relationship where you felt threatened, hurt or afraid?No   Does patient need assistance? Functional Status Self care Ambulation Normal Comments no missed doses    Physical Exam  General:  alert and well-nourished.   Mouth:  good dentition and pharynx pink and moist.   Heart:  normal rate, regular rhythm, and no murmur.          Medication Adherence: 01/05/2010   Adherence to medications reviewed with patient. Counseling to provide adequate adherence provided                                Impression & Recommendations:  Problem # 1:  HIV DISEASE (ICD-042) His HIV infection is under good control.  I will continue his current regimen Diagnostics Reviewed:  HIV: CDC-defined AIDS (03/10/2008)   CD4: 340 (12/28/2009)   WBC: 4.3 (12/27/2009)  PMN (bands): 0 (10/20/2008)   Hgb: 15.2 (12/27/2009)   HCT: 44.4 (12/27/2009)   Platelets: 179 (12/27/2009) HIV-1 RNA: <48 copies/mL (12/27/2009)   HBSAg: No (10/07/2006)  Problem # 2:  HYPERTENSION NEC (ICD-997.91) His blood pressure is much improved.  I suspect that the elevation last time may have been related to his pain.  We will continue to monitor this off medications Orders: Est. Patient Level IV (16109)  Problem # 3:  HEMATOCHEZIA (ICD-578.1) I will check his Hemoccult cards. Orders: Est. Patient Level IV (60454)  Problem # 4:  BACK PAIN, ACUTE (ICD-724.5) I have instructed him to go to the pharmacy today and get his Naprosyn and Flexeril His updated medication list for this problem includes:    Naprosyn 500 Mg Tabs (Naproxen) .Marland Kitchen... Take 1 tablet by mouth two times a day    Flexeril 5 Mg Tabs (Cyclobenzaprine hcl) .Marland Kitchen... Take 1 tablet by mouth  three times a day  Orders: Est. Patient Level IV (09811)  Patient Instructions: 1)  Please schedule a follow-up appointment in 2 months.

## 2010-10-17 ENCOUNTER — Encounter: Payer: Self-pay | Admitting: Internal Medicine

## 2010-10-17 ENCOUNTER — Other Ambulatory Visit: Payer: Self-pay | Admitting: Internal Medicine

## 2010-10-17 ENCOUNTER — Other Ambulatory Visit (INDEPENDENT_AMBULATORY_CARE_PROVIDER_SITE_OTHER): Payer: Self-pay

## 2010-10-17 DIAGNOSIS — B20 Human immunodeficiency virus [HIV] disease: Secondary | ICD-10-CM

## 2010-10-17 LAB — CONVERTED CEMR LAB
Albumin: 5 g/dL (ref 3.5–5.2)
BUN: 14 mg/dL (ref 6–23)
Basophils Absolute: 0 10*3/uL (ref 0.0–0.1)
Calcium: 10.2 mg/dL (ref 8.4–10.5)
Chloride: 104 meq/L (ref 96–112)
Creatinine, Ser: 1.34 mg/dL (ref 0.40–1.50)
Glucose, Bld: 99 mg/dL (ref 70–99)
HCT: 45.4 % (ref 39.0–52.0)
HDL: 39 mg/dL — ABNORMAL LOW (ref >39)
HIV 1 RNA Quant: 24 copies/mL — ABNORMAL HIGH (ref <20)
Hemoglobin: 15.8 g/dL (ref 13.0–17.0)
LDL Cholesterol: 120 mg/dL — ABNORMAL HIGH (ref 0–99)
Lymphocytes Relative: 51 % — ABNORMAL HIGH (ref 12–46)
Lymphs Abs: 2.3 10*3/uL (ref 0.7–4.0)
Monocytes Absolute: 0.3 10*3/uL (ref 0.1–1.0)
Monocytes Relative: 6 % (ref 3–12)
Neutro Abs: 1.8 10*3/uL (ref 1.7–7.7)
Potassium: 4.1 meq/L (ref 3.5–5.3)
RBC: 4.41 M/uL (ref 4.22–5.81)
Total CHOL/HDL Ratio: 4.9
VLDL: 31 mg/dL (ref 0–40)
WBC: 4.5 10*3/uL (ref 4.0–10.5)

## 2010-10-18 ENCOUNTER — Encounter (INDEPENDENT_AMBULATORY_CARE_PROVIDER_SITE_OTHER): Payer: Self-pay | Admitting: *Deleted

## 2010-10-24 NOTE — Miscellaneous (Signed)
  Clinical Lists Changes  Observations: Added new observation of AIDSDAP: PENDING APPROVAL 2012 (10/18/2010 10:29) Added new observation of PCTFPL: 25.72  (10/18/2010 10:29) Added new observation of HOUSEINCOME: 3748  (10/18/2010 10:29) Added new observation of FINASSESSDT: 10/18/2010  (10/18/2010 10:29)

## 2010-11-01 ENCOUNTER — Telehealth: Payer: Self-pay | Admitting: Internal Medicine

## 2010-11-01 NOTE — Telephone Encounter (Signed)
I called and left a message for Billy [name] regarding his ADAP application being in pending status due to not enough information regarding how he meets basic living expenses.  I have asked that he either contact me or Kandice Robinsons with a more detailed description.  kr

## 2010-11-02 ENCOUNTER — Telehealth: Payer: Self-pay | Admitting: Internal Medicine

## 2010-11-02 NOTE — Telephone Encounter (Signed)
I called and left a message for Mr. Billy [name] regarding his ADAP application being in pending status due to not enough information regarding how he meets basic living expenses. I have asked that he either contact me or Kandice Robinsons with a more detailed description.Marland KitchenMarland KitchenAntionette Fairy

## 2010-11-16 LAB — T-HELPER CELL (CD4) - (RCID CLINIC ONLY)
CD4 % Helper T Cell: 19 % — ABNORMAL LOW (ref 33–55)
CD4 T Cell Abs: 310 uL — ABNORMAL LOW (ref 400–2700)

## 2010-11-21 ENCOUNTER — Ambulatory Visit: Payer: Self-pay | Admitting: Internal Medicine

## 2010-11-23 LAB — T-HELPER CELL (CD4) - (RCID CLINIC ONLY): CD4 T Cell Abs: 250 uL — ABNORMAL LOW (ref 400–2700)

## 2010-12-11 ENCOUNTER — Ambulatory Visit: Payer: Self-pay | Admitting: Internal Medicine

## 2011-03-05 ENCOUNTER — Other Ambulatory Visit: Payer: Self-pay | Admitting: Licensed Clinical Social Worker

## 2011-03-05 DIAGNOSIS — B20 Human immunodeficiency virus [HIV] disease: Secondary | ICD-10-CM

## 2011-03-05 MED ORDER — LAMIVUDINE-ZIDOVUDINE 150-300 MG PO TABS: 1.0000 | ORAL_TABLET | Freq: Two times a day (BID) | ORAL | Status: DC

## 2011-03-05 MED ORDER — NEVIRAPINE 200 MG PO TABS: 200.0000 mg | ORAL_TABLET | Freq: Two times a day (BID) | ORAL | Status: DC

## 2011-03-28 ENCOUNTER — Ambulatory Visit: Payer: Self-pay

## 2011-04-02 ENCOUNTER — Other Ambulatory Visit: Payer: Self-pay

## 2011-04-02 ENCOUNTER — Other Ambulatory Visit: Payer: Self-pay | Admitting: Internal Medicine

## 2011-04-02 ENCOUNTER — Other Ambulatory Visit (INDEPENDENT_AMBULATORY_CARE_PROVIDER_SITE_OTHER): Payer: Self-pay

## 2011-04-02 DIAGNOSIS — B20 Human immunodeficiency virus [HIV] disease: Secondary | ICD-10-CM

## 2011-04-03 LAB — CBC WITH DIFFERENTIAL/PLATELET
Basophils Absolute: 0 10*3/uL (ref 0.0–0.1)
Basophils Relative: 0 % (ref 0–1)
Hemoglobin: 14.2 g/dL (ref 13.0–17.0)
MCHC: 35.6 g/dL (ref 30.0–36.0)
Neutro Abs: 2 10*3/uL (ref 1.7–7.7)
Neutrophils Relative %: 42 % — ABNORMAL LOW (ref 43–77)
RDW: 12.8 % (ref 11.5–15.5)

## 2011-04-03 LAB — COMPLETE METABOLIC PANEL WITH GFR
Albumin: 4.4 g/dL (ref 3.5–5.2)
BUN: 16 mg/dL (ref 6–23)
CO2: 26 mEq/L (ref 19–32)
GFR, Est African American: >60 mL/min (ref >60)
GFR, Est Non African American: >60 mL/min (ref >60)
Glucose, Bld: 85 mg/dL (ref 70–99)
Sodium: 138 mEq/L (ref 135–145)
Total Bilirubin: 0.4 mg/dL (ref 0.3–1.2)
Total Protein: 7 g/dL (ref 6.0–8.3)

## 2011-04-03 LAB — HIV-1 RNA QUANT-NO REFLEX-BLD: HIV 1 RNA Quant: <20 copies/mL (ref <20)

## 2011-04-04 LAB — T-HELPER CELL (CD4) - (RCID CLINIC ONLY)
CD4 % Helper T Cell: 22 % — ABNORMAL LOW (ref 33–55)
CD4 T Cell Abs: 490 uL (ref 400–2700)

## 2011-04-12 ENCOUNTER — Ambulatory Visit: Payer: Self-pay | Admitting: Internal Medicine

## 2011-05-14 LAB — T-HELPER CELL (CD4) - (RCID CLINIC ONLY)
CD4 % Helper T Cell: 14 — ABNORMAL LOW
CD4 T Cell Abs: 240 — ABNORMAL LOW

## 2011-05-17 LAB — T-HELPER CELL (CD4) - (RCID CLINIC ONLY): CD4 % Helper T Cell: 13 % — ABNORMAL LOW (ref 33–55)

## 2011-06-26 ENCOUNTER — Ambulatory Visit (INDEPENDENT_AMBULATORY_CARE_PROVIDER_SITE_OTHER): Payer: Self-pay | Admitting: Internal Medicine

## 2011-06-26 ENCOUNTER — Encounter: Payer: Self-pay | Admitting: Internal Medicine

## 2011-06-26 DIAGNOSIS — N529 Male erectile dysfunction, unspecified: Secondary | ICD-10-CM

## 2011-06-26 DIAGNOSIS — K921 Melena: Secondary | ICD-10-CM

## 2011-06-26 DIAGNOSIS — B20 Human immunodeficiency virus [HIV] disease: Secondary | ICD-10-CM

## 2011-06-26 MED ORDER — TADALAFIL 10 MG PO TABS: 10.0000 mg | ORAL_TABLET | ORAL | Status: DC | PRN

## 2011-06-26 MED ORDER — HYDROCORTISONE ACETATE 25 MG RE SUPP: 25.0000 mg | Freq: Two times a day (BID) | RECTAL | Status: AC

## 2011-06-26 NOTE — Assessment & Plan Note (Signed)
His infection remains under excellent control. I do not believe his daytime fatigue is due to his antiretroviral medications so I will continue Combivir and Viramune for now. I've suggested that he try to get more sleep.

## 2011-06-26 NOTE — Progress Notes (Signed)
  Subjective:    Patient ID: Billy [name], male    DOB: 1961/06/14, 50 y.o.   MRN: [id number]  HPI Billy [name] is in for his first visit since May of 2011. He was off of his HIV medicines during the Ramadan fast but otherwise does not think he's missed doses. He continues to struggle with intermittent constipation and occasional bright red blood per rectum. He will tendon notice a little bit of red blood on the outside of his stool and on his toilet paper. He was taking Anusol suppositories but stopped taking them about a month ago and the bleeding has been a little more frequent since that time. He does not have any pain with the bowel movement. He has not had any diarrhea.  He is working third shift and also going to school during the day. He still usually gets only 4-5 hours of sleep at night and has felt tired during the day and wondered if it was due to was medications. He has complained of this intermittently in the past and that was actually the reason he was switched from Sustiva Bactrim Viramune several years ago.  He also continues to struggle with erectile dysfunction. His testosterone level was normal last year. He did not tolerate Viagra and would like to try Cialis.    Review of Systems     Objective:   Physical Exam  Constitutional: He appears well-developed and well-nourished. He appears distressed.  HENT:  Mouth/Throat: Oropharynx is clear and moist. No oropharyngeal exudate.  Cardiovascular: Normal rate, regular rhythm and normal heart sounds.   No murmur heard. Pulmonary/Chest: Breath sounds normal. He has no wheezes. He has no rales.  Abdominal: Soft. Bowel sounds are normal. He exhibits no distension. There is no tenderness.  Genitourinary: Rectum normal.  Psychiatric: He has a normal mood and affect.   HIV 1 RNA Quant (copies/mL)  Date Value  04/02/2011 <20   10/17/2010 24*  12/27/2009 <48 copies/mL      CD4 T Cell Abs (cmm)  Date Value  04/02/2011 490   10/17/2010  420   12/27/2009 340*             Assessment & Plan:

## 2011-06-26 NOTE — Assessment & Plan Note (Signed)
I suspect that he has internal hemorrhoids and/or a small fissure related to his constipation. I've asked him to continue taking over-the-counter medication for constipation to prevent it and also to restart his Anusol suppositories. He had 3 Hemoccult cards that were negative last year at the time he was complaining of blood in his stool. I will repeat them before his visit in a few weeks.

## 2011-07-04 ENCOUNTER — Other Ambulatory Visit: Payer: Self-pay

## 2011-07-04 LAB — HEMOCCULT GUIAC POC 1CARD (OFFICE): Fecal Occult Blood, POC: NEGATIVE

## 2011-07-17 ENCOUNTER — Ambulatory Visit: Payer: Self-pay | Admitting: Internal Medicine

## 2011-07-26 ENCOUNTER — Ambulatory Visit: Payer: Self-pay | Admitting: Internal Medicine

## 2011-09-19 ENCOUNTER — Ambulatory Visit: Payer: Self-pay

## 2011-09-19 ENCOUNTER — Ambulatory Visit (INDEPENDENT_AMBULATORY_CARE_PROVIDER_SITE_OTHER): Payer: Self-pay | Admitting: Internal Medicine

## 2011-09-19 ENCOUNTER — Encounter: Payer: Self-pay | Admitting: Internal Medicine

## 2011-09-19 VITALS — BP 137/82 | HR 73 | Temp 98.2°F | Ht 74.0 in | Wt 214.0 lb

## 2011-09-19 DIAGNOSIS — K5909 Other constipation: Secondary | ICD-10-CM

## 2011-09-19 DIAGNOSIS — B20 Human immunodeficiency virus [HIV] disease: Secondary | ICD-10-CM

## 2011-09-19 MED ORDER — DOCUSATE SODIUM 250 MG PO CAPS: 250.0000 mg | ORAL_CAPSULE | Freq: Every day | ORAL | Status: AC

## 2011-09-19 NOTE — Progress Notes (Signed)
Patient ID: Billy [name], male   DOB: [birth year], 51 y.o.   MRN: [id number]  INFECTIOUS DISEASE PROGRESS NOTE    Subjective: [name] is in for his routine visit. He recalls missing only one dose of his HIV medicines since his last visit when he fell asleep before taking it. He has not had any more problem with apparent blood in his stool but still suffers with intermittent constipation about once a week. He does not take any stool softeners but does use an Anusol suppository if he feels constipated. He does use Cialis as needed for erectile dysfunction and finds that this helps.  Objective:   General: He is in good spirits Skin: No rash Lungs: Clear  Cor: regular S1 and S2 no murmurs Abdomen: Nontender   Lab Results Lab Results  Component Value Date   WBC 4.7 04/02/2011   HGB 14.2 04/02/2011   HCT 39.9 04/02/2011   MCV 100.3* 04/02/2011   PLT 186 04/02/2011    Lab Results  Component Value Date   CREATININE 1.21 04/02/2011   BUN 16 04/02/2011   NA 138 04/02/2011   K 4.0 04/02/2011   CL 104 04/02/2011   CO2 26 04/02/2011    Lab Results  Component Value Date   ALT 14 04/02/2011   AST 21 04/02/2011   ALKPHOS 51 04/02/2011   BILITOT 0.4 04/02/2011      HIV 1 RNA Quant (copies/mL)  Date Value  04/02/2011 <20   10/17/2010 24*  12/27/2009 <48 copies/mL      CD4 T Cell Abs (cmm)  Date Value  04/02/2011 490   10/17/2010 420   12/27/2009 340*     Microbiology: No results found for this or any previous visit (from the past 240 hour(s)).  Studies/Results: No results found.   Assessment: His HIV infection remains under very good control. I will continue his current regimen.  He is now had a total of 6 Hemoccult studies over the past year and a half all of which have been negative. He may have intermittent hematochezia do to chronic constipation. I have instructed him to take a daily dose of docusate.  Plan: 1. Continue Combivir and Viramune 2. Check lab work today 3. Start  docusate   Cliffton Asters, MD [facility] for Infectious Diseases [facility] [phone] pager   [phone] cell 09/19/2011, 4:04 PM

## 2011-09-20 LAB — COMPREHENSIVE METABOLIC PANEL
Albumin: 5 g/dL (ref 3.5–5.2)
Alkaline Phosphatase: 54 U/L (ref 39–117)
BUN: 14 mg/dL (ref 6–23)
Calcium: 10.2 mg/dL (ref 8.4–10.5)
Chloride: 104 mEq/L (ref 96–112)
Glucose, Bld: 87 mg/dL (ref 70–99)
Potassium: 4.2 mEq/L (ref 3.5–5.3)

## 2011-09-20 LAB — LIPID PANEL
HDL: 34 mg/dL — ABNORMAL LOW (ref >39)
Triglycerides: 255 mg/dL — ABNORMAL HIGH (ref <150)

## 2011-09-20 LAB — CBC
HCT: 41.9 % (ref 39.0–52.0)
Hemoglobin: 14.8 g/dL (ref 13.0–17.0)
MCHC: 35.3 g/dL (ref 30.0–36.0)

## 2011-09-21 LAB — HIV-1 RNA QUANT-NO REFLEX-BLD: HIV-1 RNA Quant, Log: <1.3 {Log} (ref <1.30)

## 2011-10-10 ENCOUNTER — Other Ambulatory Visit: Payer: Self-pay | Admitting: *Deleted

## 2011-10-10 DIAGNOSIS — B20 Human immunodeficiency virus [HIV] disease: Secondary | ICD-10-CM

## 2011-10-10 MED ORDER — NEVIRAPINE 200 MG PO TABS: 200.0000 mg | ORAL_TABLET | Freq: Two times a day (BID) | ORAL | Status: DC

## 2011-10-10 MED ORDER — LAMIVUDINE-ZIDOVUDINE 150-300 MG PO TABS: 1.0000 | ORAL_TABLET | Freq: Two times a day (BID) | ORAL | Status: DC

## 2011-10-16 ENCOUNTER — Other Ambulatory Visit: Payer: Self-pay | Admitting: *Deleted

## 2011-10-16 NOTE — Telephone Encounter (Signed)
Faxed back signed request to change to generic Viramune.

## 2011-10-16 NOTE — Telephone Encounter (Signed)
MD signed fax to change Combivir to generic

## 2012-02-06 ENCOUNTER — Telehealth: Payer: Self-pay | Admitting: *Deleted

## 2012-02-06 NOTE — Telephone Encounter (Signed)
Pharmacy called to get contact information on this patient as they have not been able to contact to set up delivery. Verified contact information with them.

## 2012-02-25 ENCOUNTER — Ambulatory Visit: Payer: Self-pay

## 2012-05-08 ENCOUNTER — Other Ambulatory Visit: Payer: Self-pay | Admitting: Licensed Clinical Social Worker

## 2012-05-08 DIAGNOSIS — B20 Human immunodeficiency virus [HIV] disease: Secondary | ICD-10-CM

## 2012-05-08 MED ORDER — LAMIVUDINE-ZIDOVUDINE 150-300 MG PO TABS: 1.0000 | ORAL_TABLET | Freq: Two times a day (BID) | ORAL | Status: DC

## 2012-05-08 MED ORDER — NEVIRAPINE 200 MG PO TABS: 200.0000 mg | ORAL_TABLET | Freq: Two times a day (BID) | ORAL | Status: DC

## 2012-07-14 ENCOUNTER — Telehealth: Payer: Self-pay | Admitting: Licensed Clinical Social Worker

## 2012-07-14 NOTE — Telephone Encounter (Signed)
[facility] is unable to reach this patient to schedule delivery of his medication.

## 2012-09-25 ENCOUNTER — Other Ambulatory Visit: Payer: Self-pay

## 2012-10-14 ENCOUNTER — Ambulatory Visit: Payer: Self-pay

## 2012-10-14 ENCOUNTER — Ambulatory Visit: Payer: Self-pay | Admitting: Internal Medicine

## 2012-10-14 ENCOUNTER — Other Ambulatory Visit (INDEPENDENT_AMBULATORY_CARE_PROVIDER_SITE_OTHER): Payer: Self-pay

## 2012-10-14 DIAGNOSIS — Z113 Encounter for screening for infections with a predominantly sexual mode of transmission: Secondary | ICD-10-CM

## 2012-10-14 DIAGNOSIS — Z79899 Other long term (current) drug therapy: Secondary | ICD-10-CM

## 2012-10-14 DIAGNOSIS — B20 Human immunodeficiency virus [HIV] disease: Secondary | ICD-10-CM

## 2012-10-14 LAB — CBC WITH DIFFERENTIAL/PLATELET
Basophils Relative: 0 % (ref 0–1)
Eosinophils Absolute: 0.1 10*3/uL (ref 0.0–0.7)
Eosinophils Relative: 1 % (ref 0–5)
Lymphs Abs: 2.4 10*3/uL (ref 0.7–4.0)
MCH: 35.4 pg — ABNORMAL HIGH (ref 26.0–34.0)
MCHC: 34.9 g/dL (ref 30.0–36.0)
MCV: 101.4 fL — ABNORMAL HIGH (ref 78.0–100.0)
Monocytes Relative: 7 % (ref 3–12)
Neutrophils Relative %: 39 % — ABNORMAL LOW (ref 43–77)
Platelets: 177 10*3/uL (ref 150–400)
RBC: 4.18 MIL/uL — ABNORMAL LOW (ref 4.22–5.81)

## 2012-10-14 LAB — COMPREHENSIVE METABOLIC PANEL
Alkaline Phosphatase: 57 U/L (ref 39–117)
CO2: 27 mEq/L (ref 19–32)
Creat: 1.27 mg/dL (ref 0.50–1.35)
Glucose, Bld: 86 mg/dL (ref 70–99)
Total Bilirubin: 0.4 mg/dL (ref 0.3–1.2)

## 2012-10-14 LAB — LIPID PANEL
Cholesterol: 176 mg/dL (ref 0–200)
HDL: 41 mg/dL (ref >39)
Triglycerides: 125 mg/dL (ref <150)

## 2012-10-15 ENCOUNTER — Other Ambulatory Visit: Payer: Self-pay | Admitting: Internal Medicine

## 2012-10-16 LAB — HIV-1 RNA QUANT-NO REFLEX-BLD
HIV 1 RNA Quant: <20 copies/mL (ref <20)
HIV-1 RNA Quant, Log: <1.3 {Log} (ref <1.30)

## 2012-10-28 ENCOUNTER — Encounter: Payer: Self-pay | Admitting: Internal Medicine

## 2012-10-28 ENCOUNTER — Ambulatory Visit (INDEPENDENT_AMBULATORY_CARE_PROVIDER_SITE_OTHER): Payer: Self-pay | Admitting: Internal Medicine

## 2012-10-28 VITALS — BP 147/91 | HR 72 | Temp 98.1°F | Ht 74.0 in | Wt 204.5 lb

## 2012-10-28 DIAGNOSIS — Z23 Encounter for immunization: Secondary | ICD-10-CM

## 2012-10-28 DIAGNOSIS — B20 Human immunodeficiency virus [HIV] disease: Secondary | ICD-10-CM

## 2012-10-28 DIAGNOSIS — N529 Male erectile dysfunction, unspecified: Secondary | ICD-10-CM

## 2012-10-28 MED ORDER — VARDENAFIL HCL 20 MG PO TABS: 20.0000 mg | ORAL_TABLET | Freq: Every day | ORAL | Status: DC | PRN

## 2012-10-28 NOTE — Progress Notes (Signed)
Patient ID: Billy [name], male   DOB: January 31, 1961, 52 y.o.   MRN: [id number]          [facility] for Infectious Disease  Patient Active Problem List  Diagnosis  . HIV DISEASE  . KAPOSI'S SARCOMA  . DEPRESSION  . CONJUNCTIVITIS, BILATERAL  . LYMPHEDEMA  . GERD  . CONSTIPATION, CHRONIC  . HEMATOCHEZIA  . ERECTILE DYSFUNCTION, ORGANIC  . BACK PAIN, ACUTE  . HYPERTENSION NEC  . HEPATITIS B, HX OF  . HX, PNEUMONIA  . HEMATURIA, HX OF  . CORNEAL TRANSPLANT, RIGHT    Patient's Medications  New Prescriptions   VARDENAFIL (LEVITRA) 20 MG TABLET    Take 1 tablet (20 mg total) by mouth daily as needed for erectile dysfunction.  Previous Medications   LAMIVUDINE-ZIDOVUDINE (COMBIVIR) 150-300 MG PER TABLET    Take 1 tablet by mouth 2 (two) times daily.   NEVIRAPINE (VIRAMUNE) 200 MG TABLET    Take 1 tablet (200 mg total) by mouth 2 (two) times daily.  Modified Medications   No medications on file  Discontinued Medications   HYDROCORTISONE (ANUSOL-HC) 25 MG SUPPOSITORY    Place 25 mg rectally 2 (two) times daily.   TADALAFIL (CIALIS) 10 MG TABLET    Take 1 tablet (10 mg total) by mouth as needed for erectile dysfunction.    Subjective: Billy [name] is in for his routine visit. He denies missing any doses of Combivir or Viramune since his last visit. He states he is doing well other than his ongoing problems with erectile dysfunction. He has not had any improvement with attempts to use Viagra or Cialis in the past. A testosterone level in 2011 was normal. He denies drugs or alcohol and does not think that performance anxiety is the problem. He states that he cannot maintain an erection for more than 2-3 minutes.  Objective: Temp: 98.1 F (36.7 C) (03/18 1608) Temp src: Oral (03/18 1608) BP: 147/91 mmHg (03/18 1608) Pulse Rate: 72 (03/18 1608)  General: he is appropriate in good spirits Skin: no rash Lungs: clear Cor: regular S1 and S2 no murmurs  Lab Results HIV 1 RNA  Quant (copies/mL)  Date Value  10/14/2012 <20   09/19/2011 <20   04/02/2011 <20      CD4 T Cell Abs (cmm)  Date Value  10/14/2012 480   09/19/2011 500   04/02/2011 490      Assessment: His HIV is under excellent control. He is tolerating Combivir and Viramune well but I'm sure that we can simplify his regimen. He used to take Combivir and Sustiva but had problems with drowsiness when he changed his hours at work, prompting a switch from [place] and Viramune. He's had no evidence of resistance to any regimen. I will review all of his past records before making a final decision about the simpler new regimen.  I will recheck his free and total testosterone levels. I will also switch to Cialis to Levitra to see if that helps.  Plan: 1. Review of past records and consider simplification of his antiretroviral therapy 2. Change Cialis to Levitra 3. Check testosterone level 4. Followup after lab work in 6 months   Cliffton Asters, MD [facility] for Infectious Disease [facility] [phone] pager   [phone] cell 10/28/2012, 4:26 PM

## 2012-10-29 LAB — TESTOSTERONE, FREE, TOTAL, SHBG
Sex Hormone Binding: 53 nmol/L (ref 13–71)
Testosterone, Free: 44.1 pg/mL — ABNORMAL LOW (ref 47.0–244.0)
Testosterone-% Free: 1.4 % — ABNORMAL LOW (ref 1.6–2.9)
Testosterone: 306 ng/dL (ref 300–890)

## 2012-11-13 ENCOUNTER — Encounter: Payer: Self-pay | Admitting: Internal Medicine

## 2012-11-13 ENCOUNTER — Ambulatory Visit (INDEPENDENT_AMBULATORY_CARE_PROVIDER_SITE_OTHER): Payer: Self-pay | Admitting: Internal Medicine

## 2012-11-13 VITALS — BP 154/82 | HR 59 | Temp 97.7°F | Ht 74.0 in | Wt 198.2 lb

## 2012-11-13 DIAGNOSIS — B20 Human immunodeficiency virus [HIV] disease: Secondary | ICD-10-CM

## 2012-11-13 DIAGNOSIS — E291 Testicular hypofunction: Secondary | ICD-10-CM | POA: Insufficient documentation

## 2012-11-13 MED ORDER — TESTOSTERONE 50 MG/5GM (1%) TD GEL: 5.0000 g | Freq: Every day | TRANSDERMAL | Status: DC

## 2012-11-13 MED ORDER — ELVITEG-COBIC-EMTRICIT-TENOFDF 150-150-200-300 MG PO TABS: 1.0000 | ORAL_TABLET | Freq: Every day | ORAL | Status: DC

## 2012-11-13 NOTE — Progress Notes (Signed)
Patient ID: Billy [name], male   DOB: 06/02/1961, 52 y.o.   MRN: [id number]          [facility] for Infectious Disease  Patient Active Problem List  Diagnosis  . HIV DISEASE  . KAPOSI'S SARCOMA  . DEPRESSION  . CONJUNCTIVITIS, BILATERAL  . LYMPHEDEMA  . GERD  . CONSTIPATION, CHRONIC  . HEMATOCHEZIA  . ERECTILE DYSFUNCTION, ORGANIC  . BACK PAIN, ACUTE  . HYPERTENSION NEC  . HEPATITIS B, HX OF  . HX, PNEUMONIA  . HEMATURIA, HX OF  . CORNEAL TRANSPLANT, RIGHT  . Hypogonadism male    Patient's Medications  New Prescriptions   ELVITEGRAVIR-COBICISTAT-EMTRICITABINE-TENOFOVIR (STRIBILD) 150-150-200-300 MG TABS    Take 1 tablet by mouth daily with breakfast.   TESTOSTERONE (ANDROGEL) 50 MG/5GM GEL    Place 5 g onto the skin daily.  Previous Medications   LAMIVUDINE-ZIDOVUDINE (COMBIVIR) 150-300 MG PER TABLET    Take 1 tablet by mouth 2 (two) times daily.   NEVIRAPINE (VIRAMUNE) 200 MG TABLET    Take 1 tablet (200 mg total) by mouth 2 (two) times daily.   VARDENAFIL (LEVITRA) 20 MG TABLET    Take 1 tablet (20 mg total) by mouth daily as needed for erectile dysfunction.  Modified Medications   No medications on file  Discontinued Medications   No medications on file    Subjective: Billy [name] is in for his routine followup visit. He has not missed any doses of his Combivir or Viramune since his last visit. He continues to be concerned about erectile dysfunction.  Objective: Temp: 97.7 F (36.5 C) (04/03 0923) Temp src: Oral (04/03 0923) BP: 154/82 mmHg (04/03 0923) Pulse Rate: 59 (04/03 0923)  General: He is in good spirits Skin: No rash Lungs: Clear Cor: Regular S1 and S2 no murmurs Abdomen: Nontender  Lab Results HIV 1 RNA Quant (copies/mL)  Date Value  10/14/2012 <20   09/19/2011 <20   04/02/2011 <20      CD4 T Cell Abs (cmm)  Date Value  10/14/2012 480   09/19/2011 500   04/02/2011 490    Total testosterone: 306 Free testosterone:  41  Assessment: His HIV infection remains under excellent control but he is on a moderately complicated 2 drug regimen with 4 pills a day and 2 doses. He has no history of any resistance. I will simplify his regimen to Stribild.  He has low testosterone levels. I will start him on topical testosterone replacement.  Plan: 1. Change Combivir and Viramune to Stribild 2. Start AndroGel 50 g daily 3. Followup after lab work in 3 months   Cliffton Asters, MD [facility] for Infectious Disease [facility] [phone] pager   [phone] cell 11/13/2012, 9:50 AM

## 2012-11-19 ENCOUNTER — Other Ambulatory Visit: Payer: Self-pay | Admitting: Licensed Clinical Social Worker

## 2012-11-19 DIAGNOSIS — E291 Testicular hypofunction: Secondary | ICD-10-CM

## 2012-11-19 MED ORDER — TESTOSTERONE 50 MG/5GM (1%) TD GEL: 5.0000 g | Freq: Every day | TRANSDERMAL | Status: DC

## 2013-02-03 ENCOUNTER — Other Ambulatory Visit: Payer: Self-pay

## 2013-02-03 DIAGNOSIS — B20 Human immunodeficiency virus [HIV] disease: Secondary | ICD-10-CM

## 2013-02-04 LAB — TESTOSTERONE, FREE, TOTAL, SHBG: Testosterone-% Free: 2 % (ref 1.6–2.9)

## 2013-02-04 LAB — T-HELPER CELL (CD4) - (RCID CLINIC ONLY): CD4 T Cell Abs: 460 uL (ref 400–2700)

## 2013-02-17 ENCOUNTER — Ambulatory Visit: Payer: Self-pay | Admitting: Internal Medicine

## 2013-03-17 ENCOUNTER — Encounter: Payer: Self-pay | Admitting: Internal Medicine

## 2013-03-17 ENCOUNTER — Ambulatory Visit (INDEPENDENT_AMBULATORY_CARE_PROVIDER_SITE_OTHER): Payer: Self-pay | Admitting: Internal Medicine

## 2013-03-17 VITALS — BP 117/74 | HR 76 | Temp 98.0°F | Ht 74.0 in | Wt 199.0 lb

## 2013-03-17 DIAGNOSIS — E291 Testicular hypofunction: Secondary | ICD-10-CM

## 2013-03-17 DIAGNOSIS — B20 Human immunodeficiency virus [HIV] disease: Secondary | ICD-10-CM

## 2013-03-17 NOTE — Progress Notes (Signed)
Patient ID: Billy [name], male   DOB: 1960-08-29, 52 y.o.   MRN: [id number]         [facility] for Infectious Disease  Patient Active Problem List   Diagnosis Date Noted  . Hypogonadism male 11/13/2012  . HEMATOCHEZIA 12/27/2009  . HYPERTENSION NEC 12/27/2009  . 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 BACK PAIN, ACUTE 03/10/2008  . CONSTIPATION, CHRONIC 11/03/2007  . HIV DISEASE 10/03/2006  . KAPOSI'S SARCOMA 10/03/2006  . DEPRESSION 10/03/2006  . CONJUNCTIVITIS, BILATERAL 10/03/2006  . LYMPHEDEMA 10/03/2006  . GERD 10/03/2006  . HEPATITIS B, HX OF 10/03/2006  . HX, PNEUMONIA 10/03/2006  . HEMATURIA, HX OF 10/03/2006  . CORNEAL TRANSPLANT, RIGHT 10/03/2006    Patient's Medications  New Prescriptions   No medications on file  Previous Medications   ELVITEGRAVIR-COBICISTAT-EMTRICITABINE-TENOFOVIR (STRIBILD) 150-150-200-300 MG TABS    Take 1 tablet by mouth daily with breakfast.   TESTOSTERONE (ANDROGEL) 50 MG/5GM GEL    Place 5 g onto the skin daily.   VARDENAFIL (LEVITRA) 20 MG TABLET    Take 1 tablet (20 mg total) by mouth daily as needed for erectile dysfunction.  Modified Medications   No medications on file  Discontinued Medications   No medications on file    Subjective: Brianna he is in for his routine visit. He recalls missing only 2 days of his Stribild since his last visit. That occurred when he went out of town to [place] and forgot to take his medication with him. He had been using the testosterone gel but did not notice any improvement in his decreased libido and erectile dysfunction. He does not think the Levitra helps his erectile dysfunction and it gives him a headache. He had similar problems when he tried Viagra several years ago. He does not sleep much during the week when he is working and tries to catch up on the weekends. He states occasionally he worries about things but does not feel depressed. He states that usually he is quite  happy.  Review of Systems: Constitutional: negative Eyes: negative Ears, nose, mouth, throat, and face: negative Respiratory: negative Cardiovascular: negative Gastrointestinal: negative Genitourinary:negative  No past medical history on file.  History  Substance Use Topics  . Smoking status: Never Smoker   . Smokeless tobacco: Never Used  . Alcohol Use: No    No family history on file.  No Known Allergies  Objective: Temp: 98 F (36.7 C) (08/05 1625) Temp src: Oral (08/05 1625) BP: 117/74 mmHg (08/05 1625) Pulse Rate: 76 (08/05 1625)  General: He appears healthy and in good spirits Oral: No oropharyngeal lesions Skin: No rash Lungs: Clear Cor: Regular S1 and S2 and no murmurs Abdomen: Soft and nontender Mood and affect: Normal  HIV 1 RNA Quant (copies/mL)  Date Value  02/03/2013 <20   10/14/2012 <20   09/19/2011 <20      CD4 T Cell Abs (cmm)  Date Value  02/03/2013 460   10/14/2012 480   09/19/2011 500      Assessment: His HIV infection remains under excellent control. I will continue Stribild.  His testosterone did increase from below normal back into the normal range while on testosterone gel but, not surprisingly he did not have any beneficial impact on his decreased libido and erectile dysfunction. The cause of his decreased libido and erectile dysfunction is unclear. He does not have diabetes, vascular disease and is not a smoker and does not drink alcohol. I do not feel he is depressed. There  may be a component of performance anxiety. He does not want to try other ED medications or Levitra at a higher dose.  Plan: 1. Continue Stribild 2. He wants to continue testosterone gel 3. Followup after lab work in 6 months   Cliffton Asters, MD [facility] for Infectious Disease [facility] [phone] pager   [phone] cell 03/17/2013, 5:13 PM

## 2013-04-27 ENCOUNTER — Ambulatory Visit: Payer: Self-pay

## 2013-06-08 ENCOUNTER — Other Ambulatory Visit: Payer: Self-pay | Admitting: *Deleted

## 2013-06-08 DIAGNOSIS — B20 Human immunodeficiency virus [HIV] disease: Secondary | ICD-10-CM

## 2013-06-08 MED ORDER — ELVITEG-COBIC-EMTRICIT-TENOFDF 150-150-200-300 MG PO TABS: 1.0000 | ORAL_TABLET | Freq: Every day | ORAL | Status: DC

## 2013-06-18 ENCOUNTER — Other Ambulatory Visit: Payer: Self-pay

## 2013-08-10 ENCOUNTER — Ambulatory Visit (INDEPENDENT_AMBULATORY_CARE_PROVIDER_SITE_OTHER): Payer: 59

## 2013-08-10 ENCOUNTER — Encounter: Payer: Self-pay | Admitting: Podiatry

## 2013-08-10 ENCOUNTER — Ambulatory Visit (INDEPENDENT_AMBULATORY_CARE_PROVIDER_SITE_OTHER): Payer: 59 | Admitting: Podiatry

## 2013-08-10 VITALS — BP 98/64 | HR 60 | Resp 12 | Ht 74.0 in | Wt 192.0 lb

## 2013-08-10 DIAGNOSIS — M722 Plantar fascial fibromatosis: Secondary | ICD-10-CM

## 2013-08-10 DIAGNOSIS — R52 Pain, unspecified: Secondary | ICD-10-CM

## 2013-08-10 MED ORDER — TRIAMCINOLONE ACETONIDE 10 MG/ML IJ SUSP
10.0000 mg | Freq: Once | INTRAMUSCULAR | Status: AC
  Administered 2013-08-10: 10 mg

## 2013-08-10 NOTE — Progress Notes (Signed)
Subjective:     Patient ID: Billy [name], male   DOB: 03/04/1961, 52 y.o.   MRN: [id number]  HPI patient presents with six-month history of heel pain left stating it is getting worse over that time when sitting or after periods of ambulation and is now starting to hurt throughout the day   Review of Systems  All other systems reviewed and are negative.       Objective:   Physical Exam  Nursing note and vitals reviewed. Constitutional: He is oriented to person, place, and time.  Cardiovascular: Intact distal pulses.   Musculoskeletal: Normal range of motion.  Neurological: He is oriented to person, place, and time.  Skin: Skin is warm.   neurovascular status intact with pain in the plantar heel left at the insertional point of the tendon into the calcaneus with inflammation and fluid buildup at the insertion. Muscle strength adequate with no equinus condition noted and range of motion adequate     Assessment:     Plantar fasciitis left heel acute nature    Plan:     H&P and x-ray reviewed. Injected the plantar fascial left 3 mg Kenalog 5 mg Xylocaine Marcaine mixture and dispensed fascially brace with instructions on usage. Reappoint one week

## 2013-08-10 NOTE — Patient Instructions (Signed)

## 2013-08-10 NOTE — Progress Notes (Signed)
N-SORE L-LT FOOT HEEL D- 6 MONTH O-SLOWLY C-WORSE A-PRESSURE T-BENGAY CREAM

## 2013-08-10 NOTE — Progress Notes (Signed)
   Subjective:    Patient ID: Billy [name], male    DOB: 08-09-1961, 52 y.o.   MRN: [id number]  HPI    Review of Systems  All other systems reviewed and are negative.       Objective:   Physical Exam        Assessment & Plan:

## 2013-08-17 ENCOUNTER — Ambulatory Visit: Payer: 59 | Admitting: Podiatry

## 2013-08-20 ENCOUNTER — Ambulatory Visit: Payer: 59 | Admitting: Podiatry

## 2013-08-27 ENCOUNTER — Ambulatory Visit: Payer: 59 | Admitting: Podiatry

## 2013-10-01 ENCOUNTER — Ambulatory Visit: Payer: Self-pay

## 2013-10-01 ENCOUNTER — Other Ambulatory Visit (INDEPENDENT_AMBULATORY_CARE_PROVIDER_SITE_OTHER): Payer: BC Managed Care – PPO

## 2013-10-01 DIAGNOSIS — E291 Testicular hypofunction: Secondary | ICD-10-CM

## 2013-10-01 DIAGNOSIS — Z79899 Other long term (current) drug therapy: Secondary | ICD-10-CM

## 2013-10-01 DIAGNOSIS — Z113 Encounter for screening for infections with a predominantly sexual mode of transmission: Secondary | ICD-10-CM

## 2013-10-01 DIAGNOSIS — B20 Human immunodeficiency virus [HIV] disease: Secondary | ICD-10-CM

## 2013-10-01 LAB — COMPREHENSIVE METABOLIC PANEL
ALBUMIN: 4.8 g/dL (ref 3.5–5.2)
ALT: 26 U/L (ref 0–53)
AST: 27 U/L (ref 0–37)
Alkaline Phosphatase: 66 U/L (ref 39–117)
BUN: 15 mg/dL (ref 6–23)
CO2: 31 mEq/L (ref 19–32)
Calcium: 10.1 mg/dL (ref 8.4–10.5)
Chloride: 101 mEq/L (ref 96–112)
Creat: 1.47 mg/dL — ABNORMAL HIGH (ref 0.50–1.35)
GLUCOSE: 73 mg/dL (ref 70–99)
POTASSIUM: 4 meq/L (ref 3.5–5.3)
Sodium: 138 mEq/L (ref 135–145)
TOTAL PROTEIN: 7.3 g/dL (ref 6.0–8.3)
Total Bilirubin: 0.4 mg/dL (ref 0.2–1.2)

## 2013-10-01 LAB — LIPID PANEL
CHOLESTEROL: 177 mg/dL (ref 0–200)
HDL: 34 mg/dL — ABNORMAL LOW (ref >39)
LDL Cholesterol: 71 mg/dL (ref 0–99)
Total CHOL/HDL Ratio: 5.2 Ratio
Triglycerides: 359 mg/dL — ABNORMAL HIGH (ref <150)
VLDL: 72 mg/dL — ABNORMAL HIGH (ref 0–40)

## 2013-10-01 LAB — CBC
HCT: 45.3 % (ref 39.0–52.0)
Hemoglobin: 15.4 g/dL (ref 13.0–17.0)
MCH: 31.4 pg (ref 26.0–34.0)
MCHC: 34 g/dL (ref 30.0–36.0)
MCV: 92.3 fL (ref 78.0–100.0)
PLATELETS: 194 10*3/uL (ref 150–400)
RBC: 4.91 MIL/uL (ref 4.22–5.81)
RDW: 14.3 % (ref 11.5–15.5)
WBC: 4.9 10*3/uL (ref 4.0–10.5)

## 2013-10-02 LAB — T-HELPER CELL (CD4) - (RCID CLINIC ONLY)
CD4 % Helper T Cell: 23 % — ABNORMAL LOW (ref 33–55)
CD4 T CELL ABS: 590 /uL (ref 400–2700)

## 2013-10-02 LAB — TESTOSTERONE, FREE, TOTAL, SHBG
SEX HORMONE BINDING: 37 nmol/L (ref 13–71)
Testosterone, Free: 54.6 pg/mL (ref 47.0–244.0)
Testosterone-% Free: 1.8 % (ref 1.6–2.9)
Testosterone: 298 ng/dL — ABNORMAL LOW (ref 300–890)

## 2013-10-02 LAB — RPR

## 2013-10-05 LAB — HIV-1 RNA QUANT-NO REFLEX-BLD: HIV-1 RNA Quant, Log: <1.3 {Log} (ref <1.30)

## 2013-10-08 ENCOUNTER — Other Ambulatory Visit: Payer: Self-pay

## 2013-10-09 ENCOUNTER — Other Ambulatory Visit: Payer: Self-pay

## 2013-10-09 DIAGNOSIS — N529 Male erectile dysfunction, unspecified: Secondary | ICD-10-CM

## 2013-10-09 MED ORDER — ANDROGEL 50 MG/5GM (1%) TD GEL: 5.0000 g | Freq: Every day | TRANSDERMAL | Status: DC

## 2013-10-13 ENCOUNTER — Ambulatory Visit (INDEPENDENT_AMBULATORY_CARE_PROVIDER_SITE_OTHER): Payer: BC Managed Care – PPO | Admitting: Internal Medicine

## 2013-10-13 ENCOUNTER — Telehealth: Payer: Self-pay | Admitting: *Deleted

## 2013-10-13 ENCOUNTER — Encounter: Payer: Self-pay | Admitting: Internal Medicine

## 2013-10-13 VITALS — BP 136/90 | HR 65 | Temp 98.2°F | Ht 74.0 in | Wt 210.8 lb

## 2013-10-13 DIAGNOSIS — B20 Human immunodeficiency virus [HIV] disease: Secondary | ICD-10-CM

## 2013-10-13 MED ORDER — ELVITEG-COBIC-EMTRICIT-TENOFDF 150-150-200-300 MG PO TABS: 1.0000 | ORAL_TABLET | Freq: Every day | ORAL | Status: DC

## 2013-10-13 NOTE — Progress Notes (Signed)
Patient ID: Billy [name], male   DOB: Apr 27, 1961, 53 y.o.   MRN: [id number]          [facility] for Infectious Disease  Patient Active Problem List   Diagnosis Date Noted  . HIV DISEASE 10/03/2006    Priority: High  . Hypogonadism male 11/13/2012  . HEMATOCHEZIA 12/27/2009  . HYPERTENSION NEC 12/27/2009  . 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 BACK PAIN, ACUTE 03/10/2008  . CONSTIPATION, CHRONIC 11/03/2007  . [place] SARCOMA 10/03/2006  . DEPRESSION 10/03/2006  . CONJUNCTIVITIS, BILATERAL 10/03/2006  . LYMPHEDEMA 10/03/2006  . GERD 10/03/2006  . HEPATITIS B, HX OF 10/03/2006  . HX, PNEUMONIA 10/03/2006  . HEMATURIA, HX OF 10/03/2006  . CORNEAL TRANSPLANT, RIGHT 10/03/2006    Patient's Medications  New Prescriptions   No medications on file  Previous Medications   ANDROGEL 50 MG/5GM GEL    Place 5 g onto the skin daily.  Modified Medications   Modified Medication Previous Medication   ELVITEGRAVIR-COBICISTAT-EMTRICITABINE-TENOFOVIR (STRIBILD) 150-150-200-300 MG TABS TABLET elvitegravir-cobicistat-emtricitabine-tenofovir (STRIBILD) 150-150-200-300 MG TABS tablet      Take 1 tablet by mouth daily with breakfast.    Take 1 tablet by mouth daily with breakfast.  Discontinued Medications   No medications on file    Subjective: Billy [name] is in for his routine visit. He thinks he's only missed one dose of Stribild since his last visit. He has been doing well without any complaints or concerns. He has an incentive with his employer to undergo blood work to screen for high cholesterol, diabetes and hypertension. He wants to know if that sounds like a good idea. Review of Systems: Pertinent items are noted in HPI.  No past medical history on file.  History  Substance Use Topics  . Smoking status: Never Smoker   . Smokeless tobacco: Never Used  . Alcohol Use: No    No family history on file.  No Known Allergies  Objective: Temp: 98.2 F (36.8 C)  (03/03 1636) Temp src: Oral (03/03 1636) BP: 136/90 mmHg (03/03 1636) Pulse Rate: 65 (03/03 1636)  Body mass index is 27.05 kg/(m^2).  General: He is well dressed and in good spirits Oral: No oropharyngeal lesions Skin: No rash Lungs: Clear Cor: Regular S1-S2 no murmurs  Lab Results Lab Results  Component Value Date   WBC 4.9 10/01/2013   HGB 15.4 10/01/2013   HCT 45.3 10/01/2013   MCV 92.3 10/01/2013   PLT 194 10/01/2013    Lab Results  Component Value Date   CREATININE 1.47* 10/01/2013   BUN 15 10/01/2013   NA 138 10/01/2013   K 4.0 10/01/2013   CL 101 10/01/2013   CO2 31 10/01/2013    Lab Results  Component Value Date   ALT 26 10/01/2013   AST 27 10/01/2013   ALKPHOS 66 10/01/2013   BILITOT 0.4 10/01/2013    Lab Results  Component Value Date   CHOL 177 10/01/2013   HDL 34* 10/01/2013   LDLCALC 71 10/01/2013   TRIG 359* 10/01/2013   CHOLHDL 5.2 10/01/2013    Lab Results HIV 1 RNA Quant (copies/mL)  Date Value  10/01/2013 <20   02/03/2013 <20   10/14/2012 <20      CD4 T Cell Abs (/uL)  Date Value  10/01/2013 590   02/03/2013 460   10/14/2012 480      Assessment: His HIV infection is under excellent control he's had steady CD4 reconstitution over the last 5 years. I will continue his current antiretroviral  regimen.  He has mild, stable renal insufficiency.  He has hypertriglyceridemia. He wants to work on lifestyle modification.  Plan: 1. Continue Stribild 2. Follow up after lab work in [place] months   Michel Bickers, MD [facility] for [place] [phone] pager   [phone] cell 10/13/2013, 5:00 PM

## 2013-10-13 NOTE — Telephone Encounter (Signed)
Called the [employer].  Billy [name] was approved for $4000.00 grant. Coverage dates 10-13-13 - 10-13-14.

## 2013-11-05 ENCOUNTER — Ambulatory Visit (INDEPENDENT_AMBULATORY_CARE_PROVIDER_SITE_OTHER): Payer: BC Managed Care – PPO | Admitting: Podiatry

## 2013-11-05 ENCOUNTER — Encounter: Payer: Self-pay | Admitting: Podiatry

## 2013-11-05 VITALS — BP 141/90 | HR 70 | Resp 12

## 2013-11-05 DIAGNOSIS — M722 Plantar fascial fibromatosis: Secondary | ICD-10-CM

## 2013-11-05 MED ORDER — TRIAMCINOLONE ACETONIDE 10 MG/ML IJ SUSP
10.0000 mg | Freq: Once | INTRAMUSCULAR | Status: AC
Start: 2013-11-05 — End: 2013-11-05
  Administered 2013-11-05: 10 mg

## 2013-11-05 NOTE — Patient Instructions (Signed)

## 2013-11-06 NOTE — Progress Notes (Signed)
Subjective:     Patient ID: Billy [name], male   DOB: [birth year], 53 y.o.   MRN: [id number]  HPI patient continues to experience discomfort in the left plantar heel and arch with inflammation and fluid still noted upon palpation   Review of Systems     Objective:   Physical Exam Discomfort still noted plantar arch when I palpated the area with a chronic depression of the arch noted    Assessment:     Plantar fasciitis of the left with mechanical dysfunction as part of the problem    Plan:     Injected the left plantar fascia 3 mg Kenalog 5 mg Xylocaine Marcaine mixture and scanned for custom orthotics to lift the plantar arch

## 2013-11-27 ENCOUNTER — Ambulatory Visit (INDEPENDENT_AMBULATORY_CARE_PROVIDER_SITE_OTHER): Payer: BC Managed Care – PPO | Admitting: *Deleted

## 2013-11-27 DIAGNOSIS — R52 Pain, unspecified: Secondary | ICD-10-CM

## 2013-11-27 NOTE — Progress Notes (Signed)
   Subjective:    Patient ID: Billy [name], male    DOB: [birth year], 53 y.o.   MRN: [id number]  HPI PICK UP ORTHOTICS AND GIVEN INSTRUCTION.    Review of Systems     Objective:   Physical Exam        Assessment & Plan:

## 2013-11-27 NOTE — Patient Instructions (Signed)

## 2013-12-31 ENCOUNTER — Encounter: Payer: Self-pay | Admitting: Podiatry

## 2013-12-31 ENCOUNTER — Ambulatory Visit (INDEPENDENT_AMBULATORY_CARE_PROVIDER_SITE_OTHER): Payer: BC Managed Care – PPO | Admitting: Podiatry

## 2013-12-31 VITALS — BP 128/72 | HR 67 | Resp 16

## 2013-12-31 DIAGNOSIS — M722 Plantar fascial fibromatosis: Secondary | ICD-10-CM

## 2013-12-31 NOTE — Progress Notes (Signed)
Subjective:     Patient ID: Billy [name], male   DOB: [birth year], 53 y.o.   MRN: [id number]  HPI patient presents stating my heel is getting better but my arch has been burning and I'm not sure if I'm stretching properly   Review of Systems     Objective:   Physical Exam Neurovascular status intact no changes in health history with pain of a mild nature within the arch left and heel is doing much better with minimal discomfort    Assessment:     Improving plantar fasciitis with probable transferred to the arch and possible overuse of orthotics in his early-stage    Plan:     We will reduced orthotic usage currently and I did dispense a night splint with instructions on usage stretch the plantar fascia with ice packs area patient will be seen back in 4 weeks to reevaluate

## 2014-02-04 ENCOUNTER — Ambulatory Visit: Payer: BC Managed Care – PPO | Admitting: Podiatry

## 2014-04-15 ENCOUNTER — Other Ambulatory Visit: Payer: BC Managed Care – PPO

## 2014-05-06 ENCOUNTER — Other Ambulatory Visit: Payer: BC Managed Care – PPO

## 2014-05-06 DIAGNOSIS — B20 Human immunodeficiency virus [HIV] disease: Secondary | ICD-10-CM

## 2014-05-07 LAB — COMPREHENSIVE METABOLIC PANEL
ALK PHOS: 60 U/L (ref 39–117)
ALT: 24 U/L (ref 0–53)
AST: 27 U/L (ref 0–37)
Albumin: 4.9 g/dL (ref 3.5–5.2)
BUN: 11 mg/dL (ref 6–23)
CO2: 26 mEq/L (ref 19–32)
CREATININE: 1.4 mg/dL — AB (ref 0.50–1.35)
Calcium: 10.5 mg/dL (ref 8.4–10.5)
Chloride: 103 mEq/L (ref 96–112)
Glucose, Bld: 79 mg/dL (ref 70–99)
Potassium: 4.1 mEq/L (ref 3.5–5.3)
Sodium: 138 mEq/L (ref 135–145)
Total Bilirubin: 0.5 mg/dL (ref 0.2–1.2)
Total Protein: 7.4 g/dL (ref 6.0–8.3)

## 2014-05-07 LAB — T-HELPER CELL (CD4) - (RCID CLINIC ONLY)
CD4 % Helper T Cell: 25 % — ABNORMAL LOW (ref 33–55)
CD4 T Cell Abs: 510 /uL (ref 400–2700)

## 2014-05-09 LAB — HIV-1 RNA QUANT-NO REFLEX-BLD: HIV-1 RNA Quant, Log: <1.3 {Log} (ref <1.30)

## 2014-05-13 ENCOUNTER — Ambulatory Visit: Payer: BC Managed Care – PPO | Admitting: Internal Medicine

## 2014-05-31 ENCOUNTER — Encounter: Payer: Self-pay | Admitting: Internal Medicine

## 2014-05-31 ENCOUNTER — Ambulatory Visit (INDEPENDENT_AMBULATORY_CARE_PROVIDER_SITE_OTHER): Payer: BC Managed Care – PPO | Admitting: Internal Medicine

## 2014-05-31 VITALS — BP 125/80 | HR 68 | Temp 98.1°F | Wt 206.5 lb

## 2014-05-31 DIAGNOSIS — B20 Human immunodeficiency virus [HIV] disease: Secondary | ICD-10-CM

## 2014-05-31 NOTE — Progress Notes (Signed)
Patient ID: Billy [name], male   DOB: 1961-07-11, 53 y.o.   MRN: [id number]          Patient Active Problem List   Diagnosis Date Noted  . Human immunodeficiency virus (HIV) disease 10/03/2006    Priority: High  . Hypogonadism male 11/13/2012  . HEMATOCHEZIA 12/27/2009  . HYPERTENSION NEC 12/27/2009  . 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 BACK PAIN, ACUTE 03/10/2008  . CONSTIPATION, CHRONIC 11/03/2007  . [place] SARCOMA 10/03/2006  . DEPRESSION 10/03/2006  . CONJUNCTIVITIS, BILATERAL 10/03/2006  . LYMPHEDEMA 10/03/2006  . GERD 10/03/2006  . HEPATITIS B, HX OF 10/03/2006  . HX, PNEUMONIA 10/03/2006  . HEMATURIA, HX OF 10/03/2006  . CORNEAL TRANSPLANT, RIGHT 10/03/2006    Patient's Medications  New Prescriptions   No medications on file  Previous Medications   ELVITEGRAVIR-COBICISTAT-EMTRICITABINE-TENOFOVIR (STRIBILD) 150-150-200-300 MG TABS TABLET    Take 1 tablet by mouth daily with breakfast.   MULTIPLE VITAMINS-MINERALS (MULTIVITAMIN PO)    Take by mouth.  Modified Medications   No medications on file  Discontinued Medications   ANDROGEL 50 MG/5GM GEL    Place 5 g onto the skin daily.    Subjective: Billy [name] is in for his routine visit. He recalls missing only one dose of Stribild in the past 6 months. This occurred when he was traveling out of town to [place]. He stopped taking his AndroGel because he did not feel any better. His only current problem is persistent pain in the bottom of both feet associated with plantar fasciitis. He has had 2 injections which only very brief relief. He is not taking any anti-inflammatory medication or doing any exercises. He had sharp pains in the bottom of his feet when he first gets out of bed in the morning. The pain slowly improved over the first hour he is awake. Review of Systems: Pertinent items are noted in HPI.  No past medical history on file.  History  Substance Use Topics  . Smoking status: Never Smoker     . Smokeless tobacco: Never Used  . Alcohol Use: No    No family history on file.  No Known Allergies  Objective: Temp: 98.1 F (36.7 C) (10/19 1618) Temp Source: Oral (10/19 1618) BP: 125/80 mmHg (10/19 1618) Pulse Rate: 68 (10/19 1618) Body mass index is 26.5 kg/(m^2).  General: He is in good spirits Oral: No oropharyngeal lesions Skin: No rash Lungs: Clear Cor: Regular S1 and S2 with no murmurs He is tender to palpation on the plantar surface of both feet just distal to his heels  Lab Results Lab Results  Component Value Date   WBC 4.9 10/01/2013   HGB 15.4 10/01/2013   HCT 45.3 10/01/2013   MCV 92.3 10/01/2013   PLT 194 10/01/2013    Lab Results  Component Value Date   CREATININE 1.40* 05/06/2014   BUN 11 05/06/2014   NA 138 05/06/2014   K 4.1 05/06/2014   CL 103 05/06/2014   CO2 26 05/06/2014    Lab Results  Component Value Date   ALT 24 05/06/2014   AST 27 05/06/2014   ALKPHOS 60 05/06/2014   BILITOT 0.5 05/06/2014    Lab Results  Component Value Date   CHOL 177 10/01/2013   HDL 34* 10/01/2013   LDLCALC 71 10/01/2013   TRIG 359* 10/01/2013   CHOLHDL 5.2 10/01/2013    Lab Results HIV 1 RNA Quant (copies/mL)  Date Value  05/06/2014 <20   10/01/2013 <20   02/03/2013 <  20      CD4 T Cell Abs (/uL)  Date Value  05/06/2014 510   10/01/2013 590   02/03/2013 460      Assessment: His HIV infection remains under excellent control. I will continue Stribild.  I have given written information about things he can do to help with his plantar fasciitis.  Plan: 1. Continue Stribild 2. Written information about heel pain was given to him 3. Followup after lab work in [place] months   Michel Bickers, MD [facility] for [place] [phone] pager   [phone] cell 05/31/2014, 4:41 PM

## 2014-07-30 ENCOUNTER — Other Ambulatory Visit: Payer: Self-pay | Admitting: *Deleted

## 2014-07-30 ENCOUNTER — Other Ambulatory Visit: Payer: Self-pay | Admitting: Internal Medicine

## 2014-11-15 ENCOUNTER — Other Ambulatory Visit: Payer: BC Managed Care – PPO

## 2014-11-15 DIAGNOSIS — B20 Human immunodeficiency virus [HIV] disease: Secondary | ICD-10-CM

## 2014-11-15 DIAGNOSIS — Z79899 Other long term (current) drug therapy: Secondary | ICD-10-CM

## 2014-11-15 DIAGNOSIS — Z113 Encounter for screening for infections with a predominantly sexual mode of transmission: Secondary | ICD-10-CM

## 2014-11-15 LAB — CBC
HCT: 45.8 % (ref 39.0–52.0)
Hemoglobin: 15.9 g/dL (ref 13.0–17.0)
MCH: 30.8 pg (ref 26.0–34.0)
MCHC: 34.7 g/dL (ref 30.0–36.0)
MCV: 88.8 fL (ref 78.0–100.0)
MPV: 10.5 fL (ref 8.6–12.4)
PLATELETS: 203 10*3/uL (ref 150–400)
RBC: 5.16 MIL/uL (ref 4.22–5.81)
RDW: 13.9 % (ref 11.5–15.5)
WBC: 5.2 10*3/uL (ref 4.0–10.5)

## 2014-11-16 LAB — COMPREHENSIVE METABOLIC PANEL
ALK PHOS: 56 U/L (ref 39–117)
ALT: 19 U/L (ref 0–53)
AST: 25 U/L (ref 0–37)
Albumin: 4.7 g/dL (ref 3.5–5.2)
BUN: 14 mg/dL (ref 6–23)
CALCIUM: 10 mg/dL (ref 8.4–10.5)
CHLORIDE: 104 meq/L (ref 96–112)
CO2: 26 mEq/L (ref 19–32)
Creat: 1.28 mg/dL (ref 0.50–1.35)
Glucose, Bld: 85 mg/dL (ref 70–99)
POTASSIUM: 4.5 meq/L (ref 3.5–5.3)
Sodium: 139 mEq/L (ref 135–145)
Total Bilirubin: 0.6 mg/dL (ref 0.2–1.2)
Total Protein: 7.5 g/dL (ref 6.0–8.3)

## 2014-11-16 LAB — HIV-1 RNA QUANT-NO REFLEX-BLD
HIV 1 RNA Quant: <20 copies/mL (ref <20)
HIV-1 RNA Quant, Log: <1.3 {Log} (ref <1.30)

## 2014-11-16 LAB — T-HELPER CELL (CD4) - (RCID CLINIC ONLY)
CD4 % Helper T Cell: 28 % — ABNORMAL LOW (ref 33–55)
CD4 T CELL ABS: 480 /uL (ref 400–2700)

## 2014-11-16 LAB — LIPID PANEL
CHOL/HDL RATIO: 4.4 ratio
Cholesterol: 176 mg/dL (ref 0–200)
HDL: 40 mg/dL (ref >=40)
LDL CALC: 116 mg/dL — AB (ref 0–99)
Triglycerides: 101 mg/dL (ref <150)
VLDL: 20 mg/dL (ref 0–40)

## 2014-11-16 LAB — RPR

## 2015-03-08 ENCOUNTER — Other Ambulatory Visit: Payer: Self-pay | Admitting: Internal Medicine

## 2015-05-23 ENCOUNTER — Other Ambulatory Visit: Payer: 59

## 2015-05-23 ENCOUNTER — Other Ambulatory Visit: Payer: Self-pay | Admitting: Internal Medicine

## 2015-05-23 ENCOUNTER — Other Ambulatory Visit: Payer: Self-pay | Admitting: Infectious Disease

## 2015-05-23 DIAGNOSIS — B20 Human immunodeficiency virus [HIV] disease: Secondary | ICD-10-CM

## 2015-05-23 LAB — COMPREHENSIVE METABOLIC PANEL
ALT: 23 U/L (ref 9–46)
AST: 32 U/L (ref 10–35)
Albumin: 4.7 g/dL (ref 3.6–5.1)
Alkaline Phosphatase: 63 U/L (ref 40–115)
BUN: 13 mg/dL (ref 7–25)
CHLORIDE: 105 mmol/L (ref 98–110)
CO2: 26 mmol/L (ref 20–31)
Calcium: 10.3 mg/dL (ref 8.6–10.3)
Creat: 1.13 mg/dL (ref 0.70–1.33)
GLUCOSE: 83 mg/dL (ref 65–99)
POTASSIUM: 4.8 mmol/L (ref 3.5–5.3)
Sodium: 139 mmol/L (ref 135–146)
Total Bilirubin: 0.5 mg/dL (ref 0.2–1.2)
Total Protein: 7.4 g/dL (ref 6.1–8.1)

## 2015-05-23 LAB — CBC WITH DIFFERENTIAL/PLATELET
BASOS ABS: 0 10*3/uL (ref 0.0–0.1)
BASOS PCT: 1 % (ref 0–1)
EOS ABS: 0.1 10*3/uL (ref 0.0–0.7)
EOS PCT: 2 % (ref 0–5)
HCT: 48.2 % (ref 39.0–52.0)
Hemoglobin: 16.7 g/dL (ref 13.0–17.0)
LYMPHS ABS: 1.5 10*3/uL (ref 0.7–4.0)
Lymphocytes Relative: 34 % (ref 12–46)
MCH: 30.9 pg (ref 26.0–34.0)
MCHC: 34.6 g/dL (ref 30.0–36.0)
MCV: 89.1 fL (ref 78.0–100.0)
MPV: 10.5 fL (ref 8.6–12.4)
Monocytes Absolute: 0.3 10*3/uL (ref 0.1–1.0)
Monocytes Relative: 7 % (ref 3–12)
Neutro Abs: 2.5 10*3/uL (ref 1.7–7.7)
Neutrophils Relative %: 56 % (ref 43–77)
PLATELETS: 176 10*3/uL (ref 150–400)
RBC: 5.41 MIL/uL (ref 4.22–5.81)
RDW: 14.5 % (ref 11.5–15.5)
WBC: 4.4 10*3/uL (ref 4.0–10.5)

## 2015-05-23 MED ORDER — ELVITEG-COBIC-EMTRICIT-TENOFDF 150-150-200-300 MG PO TABS: 1.0000 | ORAL_TABLET | Freq: Every day | ORAL | Status: DC

## 2015-05-24 LAB — T-HELPER CELL (CD4) - (RCID CLINIC ONLY)
CD4 % Helper T Cell: 29 % — ABNORMAL LOW (ref 33–55)
CD4 T Cell Abs: 390 /uL — ABNORMAL LOW (ref 400–2700)

## 2015-05-26 LAB — HIV-1 RNA QUANT-NO REFLEX-BLD: HIV-1 RNA Quant, Log: <1.3 {Log} (ref <1.30)

## 2015-06-13 ENCOUNTER — Other Ambulatory Visit: Payer: Self-pay | Admitting: Internal Medicine

## 2015-06-13 DIAGNOSIS — B20 Human immunodeficiency virus [HIV] disease: Secondary | ICD-10-CM

## 2015-06-16 ENCOUNTER — Ambulatory Visit (INDEPENDENT_AMBULATORY_CARE_PROVIDER_SITE_OTHER): Payer: 59 | Admitting: Internal Medicine

## 2015-06-16 ENCOUNTER — Encounter: Payer: Self-pay | Admitting: Internal Medicine

## 2015-06-16 DIAGNOSIS — B20 Human immunodeficiency virus [HIV] disease: Secondary | ICD-10-CM | POA: Diagnosis not present

## 2015-06-16 NOTE — Assessment & Plan Note (Signed)
His infection remains under excellent control. I talked him about the potential benefits of the newer version of Stribild called [employer]. I will have him return in January to see if his [employer] covers [employer]. I asked him to take his Stribild with a full meal.  I suggested that he try taking chewable antacid tablets if he has more of the burning chest pain. If he gets relief that not only makes him feel better but suggests that he may be having acid reflux.

## 2015-06-16 NOTE — Progress Notes (Signed)
Patient ID: Billy [name], male   DOB: Mar 02, 1961, 54 y.o.   MRN: [id number]          Patient Active Problem List   Diagnosis Date Noted  . Human immunodeficiency virus (HIV) disease ([place]) 10/03/2006    Priority: High  . Hypogonadism male 11/13/2012  . HEMATOCHEZIA 12/27/2009  . HYPERTENSION NEC 12/27/2009  . 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 BACK PAIN, ACUTE 03/10/2008  . CONSTIPATION, CHRONIC 11/03/2007  . 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 DEPRESSION 10/03/2006  . CONJUNCTIVITIS, BILATERAL 10/03/2006  . LYMPHEDEMA 10/03/2006  . GERD 10/03/2006  . HEPATITIS B, HX OF 10/03/2006  . HX, PNEUMONIA 10/03/2006  . HEMATURIA, HX OF 10/03/2006  . CORNEAL TRANSPLANT, RIGHT 10/03/2006    Patient's Medications  New Prescriptions   No medications on file  Previous Medications   MULTIPLE VITAMINS-MINERALS (MULTIVITAMIN PO)    Take by mouth.   STRIBILD 150-150-200-300 MG TABS TABLET    TAKE 1 TABLET BY MOUTH ONCE EVERY DAY IN THE MORNING WITH BREAKFAST. -TAKE WITH FOOD  Modified Medications   No medications on file  Discontinued Medications   No medications on file    Subjective: Billy [name] is in for his routine HIV follow-up visit. He continues to take his Stribild without difficulty. He takes it each morning on his way to work after eating a piece of bread and drinking a cup of coffee. He believes he has only missed 4 doses in the past 6 months. 3 of those misses occurred when his co-pay card expired recently. He is back on his Stribild now. He is feeling well with the exception of occasional episodes of burning in his mid chest. He does not believe this is related to any particular type of food, time of day or activity. It occurs maybe once a month. He has found that he can drink cold water and relieve the discomfort. He says that this may feel like heartburn. He has not had any nausea, vomiting, diarrhea or constipation. His appetite is good and he has not had any fever,  chills or sweats.   Review of Systems: Pertinent items are noted in HPI.  No past medical history on file.  Social History  Substance Use Topics  . Smoking status: Never Smoker   . Smokeless tobacco: Never Used  . Alcohol Use: No    No family history on file.  No Known Allergies  Objective:  Filed Vitals:   06/16/15 1619  BP: 131/78  Pulse: 69  Temp: 98.3 F (36.8 C)  TempSrc: Oral  Weight: 203 lb 4 oz (92.194 kg)   Body mass index is 26.08 kg/(m^2).  General: His weight is stable. He is well dressed and in no distress Oral: No oropharyngeal lesions Skin: No rash Lungs: Clear Cor: Regular S1 and S2 with no murmurs Abdomen: Soft and nontender with no palpable masses Mood: He does not appear anxious or depressed  Lab Results Lab Results  Component Value Date   WBC 4.4 05/23/2015   HGB 16.7 05/23/2015   HCT 48.2 05/23/2015   MCV 89.1 05/23/2015   PLT 176 05/23/2015    Lab Results  Component Value Date   CREATININE 1.13 05/23/2015   BUN 13 05/23/2015   NA 139 05/23/2015   K 4.8 05/23/2015   CL 105 05/23/2015   CO2 26 05/23/2015    Lab Results  Component Value Date   ALT 23 05/23/2015   AST 32 05/23/2015   ALKPHOS 63 05/23/2015   BILITOT 0.5  05/23/2015    Lab Results  Component Value Date   CHOL 176 11/15/2014   HDL 40 11/15/2014   LDLCALC 116* 11/15/2014   TRIG 101 11/15/2014   CHOLHDL 4.4 11/15/2014    Lab Results HIV 1 RNA QUANT (copies/mL)  Date Value  05/23/2015 <20  11/15/2014 <20  05/06/2014 <20   CD4 T CELL ABS (/uL)  Date Value  05/23/2015 390*  11/15/2014 480  05/06/2014 510     Problem List Items Addressed This Visit      High   Human immunodeficiency virus (HIV) disease ([place])    His infection remains under excellent control. I talked him about the potential benefits of the newer version of Stribild called Genvoya. I will have him return in January to see if his [employer] covers [employer]. I asked him to take  his Stribild with a full meal.  I suggested that he try taking chewable antacid tablets if he has more of the burning chest pain. If he gets relief that not only makes him feel better but suggests that he may be having acid reflux.      Relevant Orders   HIV 1 RNA quant-no reflex-bld   Comprehensive metabolic panel   RPR        Billy Bickers, MD [facility] for [place] [phone] pager   [phone] cell 06/16/2015, 6:03 PM

## 2015-07-06 ENCOUNTER — Other Ambulatory Visit: Payer: Self-pay | Admitting: *Deleted

## 2015-07-06 DIAGNOSIS — B20 Human immunodeficiency virus [HIV] disease: Secondary | ICD-10-CM

## 2015-07-06 MED ORDER — ELVITEG-COBIC-EMTRICIT-TENOFDF 150-150-200-300 MG PO TABS: ORAL_TABLET | ORAL | Status: DC

## 2015-07-11 ENCOUNTER — Encounter: Payer: Self-pay | Admitting: Podiatry

## 2015-07-11 ENCOUNTER — Ambulatory Visit (INDEPENDENT_AMBULATORY_CARE_PROVIDER_SITE_OTHER): Payer: 59 | Admitting: Podiatry

## 2015-07-11 VITALS — BP 127/80 | HR 60 | Resp 12

## 2015-07-11 DIAGNOSIS — M722 Plantar fascial fibromatosis: Secondary | ICD-10-CM | POA: Diagnosis not present

## 2015-07-11 MED ORDER — TRIAMCINOLONE ACETONIDE 10 MG/ML IJ SUSP
10.0000 mg | Freq: Once | INTRAMUSCULAR | Status: AC
  Administered 2015-07-11: 10 mg

## 2015-07-13 NOTE — Progress Notes (Signed)
Subjective:     Patient ID: Billy [name], male   DOB: [birth year], 54 y.o.   MRN: [id number]  HPI patient states that both of my heels and become very sore again and make it hard for me to walk.   Review of Systems     Objective:   Physical Exam Neurovascular status intact muscle strength adequate range of motion within normal limits with discomfort in the plantar heel of both feet which is present and tender with exquisite discomfort upon the insertion of the tendon into the calcaneus    Assessment:     Acute plantar fasciitis bilateral    Plan:     Inject the plantar fascial bilateral at its insertion 3 mg Kenalog 5 mg Xylocaine and instructed on physical therapy and shoe gear modifications

## 2015-07-21 ENCOUNTER — Ambulatory Visit (INDEPENDENT_AMBULATORY_CARE_PROVIDER_SITE_OTHER): Payer: 59 | Admitting: Podiatry

## 2015-07-21 DIAGNOSIS — M722 Plantar fascial fibromatosis: Secondary | ICD-10-CM | POA: Diagnosis not present

## 2015-07-21 MED ORDER — TRIAMCINOLONE ACETONIDE 10 MG/ML IJ SUSP
10.0000 mg | Freq: Once | INTRAMUSCULAR | Status: AC
  Administered 2015-07-21: 10 mg

## 2015-07-24 NOTE — Progress Notes (Signed)
Subjective:     Patient ID: Billy [name], male   DOB: 09-06-1960, 54 y.o.   MRN: [id number]  HPI patient states my right heel is doing quite well and I'm still having some discomfort on my left plantar heel   Review of Systems     Objective:   Physical Exam Neurovascular status intact with significant diminishment of discomfort right plantar heel with pain in the left heel insertional point tendon calcaneus    Assessment:     Doing well plantar fasciitis right with pain in the left plantar heel    Plan:     Reinjected the plantar fascial left 3 mg Kenalog 5 mg Xylocaine and advised on continued stretching and shoe gear modifications reappoint as needed

## 2015-08-30 ENCOUNTER — Ambulatory Visit: Payer: 59 | Admitting: Internal Medicine

## 2015-09-08 ENCOUNTER — Ambulatory Visit (INDEPENDENT_AMBULATORY_CARE_PROVIDER_SITE_OTHER): Payer: 59 | Admitting: Internal Medicine

## 2015-09-08 ENCOUNTER — Encounter: Payer: Self-pay | Admitting: Internal Medicine

## 2015-09-08 VITALS — BP 142/83 | HR 69 | Temp 97.7°F | Wt 204.0 lb

## 2015-09-08 DIAGNOSIS — K219 Gastro-esophageal reflux disease without esophagitis: Secondary | ICD-10-CM

## 2015-09-08 DIAGNOSIS — I10 Essential (primary) hypertension: Secondary | ICD-10-CM | POA: Diagnosis not present

## 2015-09-08 DIAGNOSIS — B20 Human immunodeficiency virus [HIV] disease: Secondary | ICD-10-CM | POA: Diagnosis not present

## 2015-09-08 MED ORDER — ELVITEG-COBIC-EMTRICIT-TENOFAF 150-150-200-10 MG PO TABS: 1.0000 | ORAL_TABLET | Freq: Every day | ORAL | Status: DC

## 2015-09-08 NOTE — Assessment & Plan Note (Signed)
His infection remains under very good control. I will switch him to the new version of Stribild called Genvoya. He will continue to take it each morning with breakfast. He will follow-up after lab work in 6 months.  He has been given written information about new primary care provider is within her network.

## 2015-09-08 NOTE — Addendum Note (Signed)
Addended by: Lorne Skeens D on: 09/08/2015 04:57 PM   Modules accepted: Orders

## 2015-09-08 NOTE — Progress Notes (Signed)
Patient Active Problem List   Diagnosis Date Noted  . Human immunodeficiency virus (HIV) disease ([place]) 10/03/2006    Priority: High  . Hypogonadism male 11/13/2012  . HEMATOCHEZIA 12/27/2009  . HYPERTENSION NEC 12/27/2009  . 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 BACK PAIN, ACUTE 03/10/2008  . CONSTIPATION, CHRONIC 11/03/2007  . 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 DEPRESSION 10/03/2006  . CONJUNCTIVITIS, BILATERAL 10/03/2006  . LYMPHEDEMA 10/03/2006  . GERD 10/03/2006  . HEPATITIS B, HX OF 10/03/2006  . HX, PNEUMONIA 10/03/2006  . HEMATURIA, HX OF 10/03/2006  . CORNEAL TRANSPLANT, RIGHT 10/03/2006    Patient's Medications  New Prescriptions   ELVITEGRAVIR-COBICISTAT-EMTRICITABINE-TENOFOVIR (GENVOYA) 150-150-200-10 MG TABS TABLET    Take 1 tablet by mouth daily with breakfast.  Previous Medications   No medications on file  Modified Medications   No medications on file  Discontinued Medications   ELVITEGRAVIR-COBICISTAT-EMTRICITABINE-TENOFOVIR (STRIBILD) 150-150-200-300 MG TABS TABLET    TAKE 1 TABLET BY MOUTH ONCE EVERY DAY IN THE MORNING WITH BREAKFAST. -TAKE WITH FOOD   FLUVIRIN 0.5 ML SUSY    ADM 0.5ML IM UTD   LINACLOTIDE (LINZESS) 145 MCG CAPS CAPSULE    Take 145 mcg by mouth.   MULTIPLE VITAMINS-MINERALS (MULTIVITAMIN PO)    Take by mouth.    Subjective: Billy [name] is in for his routine HIV follow-up visit. He missed 4 days of his Stribild recently when his co-pay card ran out. He got a new card and has not missed any other doses. He is not taking any other medication now. He did see a new primary care provider last year and a screening colonoscopy was recommended but never completed. That doctor's office has now moved. He has not had any more chest pain since his last visit.  Review of Systems: Review of Systems  Constitutional: Negative for fever, chills, weight loss, malaise/fatigue and diaphoresis.  Respiratory: Negative for cough, sputum production  and shortness of breath.   Cardiovascular: Negative for chest pain.  Gastrointestinal: Negative for heartburn, nausea, vomiting and diarrhea.  Musculoskeletal: Negative for joint pain.  Skin: Negative for rash.  Psychiatric/Behavioral: Negative for depression.    No past medical history on file.  Social History  Substance Use Topics  . Smoking status: Never Smoker   . Smokeless tobacco: Never Used  . Alcohol Use: No    No family history on file.  No Known Allergies  Objective:  Filed Vitals:   09/08/15 1612  BP: 142/83  Pulse: 69  Temp: 97.7 F (36.5 C)  TempSrc: Oral  Weight: 204 lb (92.534 kg)   Body mass index is 26.18 kg/(m^2).  Physical Exam  Constitutional: He is oriented to person, place, and time.  He is well dressed and in good spirits as usual.  HENT:  Mouth/Throat: No oropharyngeal exudate.  Eyes: Conjunctivae are normal.  Cardiovascular: Normal rate and regular rhythm.   No murmur heard. Pulmonary/Chest: Breath sounds normal.  Abdominal: Soft. There is no tenderness.  Musculoskeletal: Normal range of motion.  Neurological: He is alert and oriented to person, place, and time.  Skin: No rash noted.  Psychiatric: Mood and affect normal.    Lab Results Lab Results  Component Value Date   WBC 4.4 05/23/2015   HGB 16.7 05/23/2015   HCT 48.2 05/23/2015   MCV 89.1 05/23/2015   PLT 176 05/23/2015    Lab Results  Component Value Date   CREATININE 1.13 05/23/2015   BUN 13 05/23/2015   NA  139 05/23/2015   K 4.8 05/23/2015   CL 105 05/23/2015   CO2 26 05/23/2015    Lab Results  Component Value Date   ALT 23 05/23/2015   AST 32 05/23/2015   ALKPHOS 63 05/23/2015   BILITOT 0.5 05/23/2015    Lab Results  Component Value Date   CHOL 176 11/15/2014   HDL 40 11/15/2014   LDLCALC 116* 11/15/2014   TRIG 101 11/15/2014   CHOLHDL 4.4 11/15/2014    Lab Results HIV 1 RNA QUANT (copies/mL)  Date Value  05/23/2015 <20  11/15/2014 <20    05/06/2014 <20   CD4 T CELL ABS (/uL)  Date Value  05/23/2015 390*  11/15/2014 480  05/06/2014 510      Problem List Items Addressed This Visit      High   Human immunodeficiency virus (HIV) disease ([place])    His infection remains under very good control. I will switch him to the new version of Stribild called Genvoya. He will continue to take it each morning with breakfast. He will follow-up after lab work in 6 months.  He has been given written information about new primary care provider is within her network.      Relevant Medications   elvitegravir-cobicistat-emtricitabine-tenofovir (GENVOYA) 150-150-200-10 MG TABS tablet   Other Relevant Orders   T-helper cell (CD4)- ([facility] clinic only)   HIV 1 RNA quant-no reflex-bld   Lipid panel   RPR        Michel Bickers, MD [facility] for [place] [phone] pager   [phone] cell 09/08/2015, 4:30 PM

## 2016-08-20 ENCOUNTER — Other Ambulatory Visit: Payer: Self-pay | Admitting: Internal Medicine

## 2016-08-20 DIAGNOSIS — B20 Human immunodeficiency virus [HIV] disease: Secondary | ICD-10-CM

## 2016-08-28 ENCOUNTER — Other Ambulatory Visit: Payer: 59

## 2016-08-28 DIAGNOSIS — B20 Human immunodeficiency virus [HIV] disease: Secondary | ICD-10-CM

## 2016-08-28 LAB — LIPID PANEL
Cholesterol: 215 mg/dL — ABNORMAL HIGH (ref <200)
HDL: 37 mg/dL — ABNORMAL LOW (ref >40)
LDL CALC: 136 mg/dL — AB (ref <100)
Total CHOL/HDL Ratio: 5.8 Ratio — ABNORMAL HIGH (ref <5.0)
Triglycerides: 209 mg/dL — ABNORMAL HIGH (ref <150)
VLDL: 42 mg/dL — ABNORMAL HIGH (ref <30)

## 2016-08-29 LAB — T-HELPER CELL (CD4) - (RCID CLINIC ONLY)
CD4 % Helper T Cell: 24 % — ABNORMAL LOW (ref 33–55)
CD4 T Cell Abs: 540 /uL (ref 400–2700)

## 2016-08-29 LAB — RPR

## 2016-09-03 LAB — HIV-1 RNA QUANT-NO REFLEX-BLD
HIV 1 RNA QUANT: NOT DETECTED {copies}/mL (ref <20)
HIV-1 RNA QUANT, LOG: NOT DETECTED {Log_copies}/mL (ref <1.30)

## 2016-09-11 ENCOUNTER — Ambulatory Visit: Payer: 59 | Admitting: Internal Medicine

## 2016-10-01 ENCOUNTER — Other Ambulatory Visit: Payer: Self-pay

## 2016-10-01 ENCOUNTER — Telehealth: Payer: Self-pay

## 2016-10-01 DIAGNOSIS — B20 Human immunodeficiency virus [HIV] disease: Secondary | ICD-10-CM

## 2016-10-01 MED ORDER — ELVITEG-COBIC-EMTRICIT-TENOFAF 150-150-200-10 MG PO TABS: 1.0000 | ORAL_TABLET | Freq: Every day | ORAL | 2 refills | Status: DC

## 2016-10-01 NOTE — Telephone Encounter (Signed)
Patient calling to say medication was not called to pharmacy.    Medication called to pharmacy.  [facility].  Laverle Patter, RN

## 2016-10-01 NOTE — Telephone Encounter (Signed)
Patient advised he needed office visit that is why his refills were refused.  He has April visit .  Genvoya called to pharmacy with 2 refills.   Laverle Patter, RN

## 2016-10-02 ENCOUNTER — Other Ambulatory Visit: Payer: Self-pay | Admitting: *Deleted

## 2016-10-02 DIAGNOSIS — B20 Human immunodeficiency virus [HIV] disease: Secondary | ICD-10-CM

## 2016-11-14 ENCOUNTER — Encounter: Payer: Self-pay | Admitting: Internal Medicine

## 2016-11-14 ENCOUNTER — Ambulatory Visit (INDEPENDENT_AMBULATORY_CARE_PROVIDER_SITE_OTHER): Payer: 59 | Admitting: Internal Medicine

## 2016-11-14 DIAGNOSIS — B20 Human immunodeficiency virus [HIV] disease: Secondary | ICD-10-CM

## 2016-11-14 MED ORDER — ELVITEG-COBIC-EMTRICIT-TENOFAF 150-150-200-10 MG PO TABS: 1.0000 | ORAL_TABLET | Freq: Every day | ORAL | 3 refills | Status: DC

## 2016-11-14 NOTE — Assessment & Plan Note (Signed)
His infection is under excellent, long-term control. He will continue Genvoya and follow-up after lab work in one year. 

## 2016-11-14 NOTE — Progress Notes (Signed)
Patient Active Problem List   Diagnosis Date Noted  . Human immunodeficiency virus (HIV) disease ([place]) 10/03/2006    Priority: High  . Hypogonadism male 11/13/2012  . HEMATOCHEZIA 12/27/2009  . HYPERTENSION NEC 12/27/2009  . 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 BACK PAIN, ACUTE 03/10/2008  . CONSTIPATION, CHRONIC 11/03/2007  . 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 DEPRESSION 10/03/2006  . CONJUNCTIVITIS, BILATERAL 10/03/2006  . LYMPHEDEMA 10/03/2006  . GERD 10/03/2006  . HEPATITIS B, HX OF 10/03/2006  . HX, PNEUMONIA 10/03/2006  . HEMATURIA, HX OF 10/03/2006  . CORNEAL TRANSPLANT, RIGHT 10/03/2006    Patient's Medications  New Prescriptions   No medications on file  Previous Medications   No medications on file  Modified Medications   Modified Medication Previous Medication   ELVITEGRAVIR-COBICISTAT-EMTRICITABINE-TENOFOVIR (GENVOYA) 150-150-200-10 MG TABS TABLET elvitegravir-cobicistat-emtricitabine-tenofovir (GENVOYA) 150-150-200-10 MG TABS tablet      Take 1 tablet by mouth daily with breakfast.    Take 1 tablet by mouth daily with breakfast.  Discontinued Medications   No medications on file    Subjective: [place] is in for his routine HIV follow-up visit. He ran out of refills on his [place] recently requiring him to be off his medication for the past week. Otherwise he has not missed any since his last visit. He is feeling well.   Review of Systems: Review of Systems  Constitutional: Negative for chills, diaphoresis, fever, malaise/fatigue and weight loss.  HENT: Negative for sore throat.   Respiratory: Negative for cough, sputum production and shortness of breath.   Cardiovascular: Negative for chest pain.  Gastrointestinal: Negative for abdominal pain, diarrhea, heartburn, nausea and vomiting.  Genitourinary: Negative for dysuria and frequency.  Musculoskeletal: Negative for joint pain and myalgias.  Skin: Negative for rash.  Neurological:  Negative for dizziness and headaches.  Psychiatric/Behavioral: Negative for depression and substance abuse. The patient is not nervous/anxious.     No past medical history on file.  Social History  Substance Use Topics  . Smoking status: Never Smoker  . Smokeless tobacco: Never Used  . Alcohol use No    No family history on file.  No Known Allergies  Objective:  Vitals:   11/14/16 1508  BP: (!) 149/88  Pulse: 67  Temp: 97.6 F (36.4 C)  TempSrc: Oral  Weight: 203 lb (92.1 kg)  Height: 6\' 2"  (1.88 m)   Body mass index is 26.06 kg/m.  Physical Exam  Constitutional: He is oriented to person, place, and time.  HENT:  Mouth/Throat: No oropharyngeal exudate.  Eyes: Conjunctivae are normal.  Cardiovascular: Normal rate and regular rhythm.   No murmur heard. Pulmonary/Chest: Effort normal and breath sounds normal.  Abdominal: Soft. He exhibits no mass. There is no tenderness.  Musculoskeletal: Normal range of motion.  Neurological: He is alert and oriented to person, place, and time.  Skin: No rash noted.  Psychiatric: Mood and affect normal.    Lab Results Lab Results  Component Value Date   WBC 4.4 05/23/2015   HGB 16.7 05/23/2015   HCT 48.2 05/23/2015   MCV 89.1 05/23/2015   PLT 176 05/23/2015    Lab Results  Component Value Date   CREATININE 1.13 05/23/2015   BUN 13 05/23/2015   NA 139 05/23/2015   K 4.8 05/23/2015   CL 105 05/23/2015   CO2 26 05/23/2015    Lab Results  Component Value Date   ALT 23 05/23/2015   AST 32 05/23/2015  ALKPHOS 63 05/23/2015   BILITOT 0.5 05/23/2015    Lab Results  Component Value Date   CHOL 215 (H) 08/28/2016   HDL 37 (L) 08/28/2016   LDLCALC 136 (H) 08/28/2016   TRIG 209 (H) 08/28/2016   CHOLHDL 5.8 (H) 08/28/2016   HIV 1 RNA Quant (copies/mL)  Date Value  08/28/2016 <20 NOT DETECTED  05/23/2015 <20  11/15/2014 <20   CD4 T Cell Abs (/uL)  Date Value  08/28/2016 540  05/23/2015 390 (L)  11/15/2014 480      Problem List Items Addressed This Visit      High   Human immunodeficiency virus (HIV) disease ([place])    His infection is under excellent, long-term control. He will continue Genvoya and follow-up after lab work in one year.      Relevant Medications   elvitegravir-cobicistat-emtricitabine-tenofovir (GENVOYA) 150-150-200-10 MG TABS tablet   Other Relevant Orders   T-helper cell (CD4)- ([facility] clinic only)   HIV 1 RNA quant-no reflex-bld   CBC   Comprehensive metabolic panel   Lipid panel   RPR        Michel Bickers, MD [facility] for [facility] [place] [facility] [phone] pager   [phone] cell 11/14/2016, 3:40 PM

## 2017-11-01 ENCOUNTER — Other Ambulatory Visit: Payer: Self-pay | Admitting: Internal Medicine

## 2017-11-01 DIAGNOSIS — B20 Human immunodeficiency virus [HIV] disease: Secondary | ICD-10-CM

## 2017-11-07 ENCOUNTER — Other Ambulatory Visit: Payer: Self-pay | Admitting: Internal Medicine

## 2017-11-07 DIAGNOSIS — B20 Human immunodeficiency virus [HIV] disease: Secondary | ICD-10-CM

## 2017-11-25 ENCOUNTER — Other Ambulatory Visit: Payer: Self-pay | Admitting: Internal Medicine

## 2017-11-25 DIAGNOSIS — B20 Human immunodeficiency virus [HIV] disease: Secondary | ICD-10-CM

## 2018-01-17 ENCOUNTER — Other Ambulatory Visit: Payer: 59

## 2018-01-17 ENCOUNTER — Other Ambulatory Visit (HOSPITAL_COMMUNITY)
Admission: RE | Admit: 2018-01-17 | Discharge: 2018-01-17 | Disposition: A | Discharge status: Home / Self Care | Payer: 59 | Source: Ambulatory Visit | Attending: Internal Medicine | Admitting: Internal Medicine

## 2018-01-17 DIAGNOSIS — Z113 Encounter for screening for infections with a predominantly sexual mode of transmission: Secondary | ICD-10-CM

## 2018-01-17 DIAGNOSIS — B20 Human immunodeficiency virus [HIV] disease: Secondary | ICD-10-CM

## 2018-01-17 LAB — T-HELPER CELL (CD4) - (RCID CLINIC ONLY)
CD4 T CELL HELPER: 33 % (ref 33–55)
CD4 T Cell Abs: 420 /uL (ref 400–2700)

## 2018-01-20 LAB — COMPLETE METABOLIC PANEL WITH GFR
AG Ratio: 1.8 (calc) (ref 1.0–2.5)
ALKALINE PHOSPHATASE (APISO): 48 U/L (ref 40–115)
ALT: 17 U/L (ref 9–46)
AST: 22 U/L (ref 10–35)
Albumin: 4.2 g/dL (ref 3.6–5.1)
BUN: 11 mg/dL (ref 7–25)
CALCIUM: 9.6 mg/dL (ref 8.6–10.3)
CO2: 27 mmol/L (ref 20–32)
CREATININE: 1.24 mg/dL (ref 0.70–1.33)
Chloride: 104 mmol/L (ref 98–110)
GFR, EST NON AFRICAN AMERICAN: 65 mL/min/{1.73_m2} (ref >=60)
GFR, Est African American: 75 mL/min/{1.73_m2} (ref >=60)
Globulin: 2.4 g/dL (calc) (ref 1.9–3.7)
Glucose, Bld: 81 mg/dL (ref 65–99)
POTASSIUM: 3.9 mmol/L (ref 3.5–5.3)
Sodium: 140 mmol/L (ref 135–146)
Total Bilirubin: 0.5 mg/dL (ref 0.2–1.2)
Total Protein: 6.6 g/dL (ref 6.1–8.1)

## 2018-01-20 LAB — CBC WITH DIFFERENTIAL/PLATELET
Basophils Absolute: 30 cells/uL (ref 0–200)
Basophils Relative: 0.8 %
EOS PCT: 5.7 %
Eosinophils Absolute: 211 cells/uL (ref 15–500)
HCT: 42.3 % (ref 38.5–50.0)
Hemoglobin: 14.4 g/dL (ref 13.2–17.1)
Lymphs Abs: 1321 cells/uL (ref 850–3900)
MCH: 30.4 pg (ref 27.0–33.0)
MCHC: 34 g/dL (ref 32.0–36.0)
MCV: 89.4 fL (ref 80.0–100.0)
MPV: 11.3 fL (ref 7.5–12.5)
Monocytes Relative: 6.8 %
NEUTROS PCT: 51 %
Neutro Abs: 1887 cells/uL (ref 1500–7800)
PLATELETS: 174 10*3/uL (ref 140–400)
RBC: 4.73 10*6/uL (ref 4.20–5.80)
RDW: 13.1 % (ref 11.0–15.0)
Total Lymphocyte: 35.7 %
WBC mixed population: 252 cells/uL (ref 200–950)
WBC: 3.7 10*3/uL — AB (ref 3.8–10.8)

## 2018-01-20 LAB — RPR: RPR: NONREACTIVE

## 2018-01-20 LAB — URINE CYTOLOGY ANCILLARY ONLY
CHLAMYDIA, DNA PROBE: NEGATIVE
NEISSERIA GONORRHEA: NEGATIVE

## 2018-01-21 LAB — HIV-1 RNA QUANT-NO REFLEX-BLD
HIV 1 RNA Quant: 33 copies/mL — ABNORMAL HIGH
HIV-1 RNA QUANT, LOG: 1.52 {Log_copies}/mL — AB

## 2018-02-04 ENCOUNTER — Encounter: Payer: 59 | Admitting: Internal Medicine

## 2018-10-31 ENCOUNTER — Other Ambulatory Visit: Payer: 59

## 2018-11-04 ENCOUNTER — Other Ambulatory Visit: Payer: 59

## 2018-11-04 ENCOUNTER — Other Ambulatory Visit: Payer: Self-pay | Admitting: *Deleted

## 2018-11-04 DIAGNOSIS — B20 Human immunodeficiency virus [HIV] disease: Secondary | ICD-10-CM

## 2018-11-17 ENCOUNTER — Encounter: Payer: 59 | Admitting: Internal Medicine

## 2018-11-18 ENCOUNTER — Other Ambulatory Visit: Payer: Self-pay | Admitting: Internal Medicine

## 2018-11-18 DIAGNOSIS — B20 Human immunodeficiency virus [HIV] disease: Secondary | ICD-10-CM

## 2018-12-23 ENCOUNTER — Encounter: Payer: Self-pay | Admitting: Internal Medicine

## 2018-12-23 ENCOUNTER — Other Ambulatory Visit: Payer: Self-pay

## 2018-12-23 ENCOUNTER — Ambulatory Visit (INDEPENDENT_AMBULATORY_CARE_PROVIDER_SITE_OTHER): Payer: 59 | Admitting: Internal Medicine

## 2018-12-23 DIAGNOSIS — B20 Human immunodeficiency virus [HIV] disease: Secondary | ICD-10-CM

## 2018-12-23 MED ORDER — BICTEGRAVIR-EMTRICITAB-TENOFOV 50-200-25 MG PO TABS: 1.0000 | ORAL_TABLET | Freq: Every day | ORAL | 11 refills | Status: DC

## 2018-12-23 NOTE — Assessment & Plan Note (Signed)
His adherence is excellent and his infection has been under very good long-term control.  I will switch Genvoya to [place] so he can take it with or without food.  He will get blood work today and follow-up in 1 year.

## 2018-12-23 NOTE — Progress Notes (Signed)
Patient Active Problem List   Diagnosis Date Noted  . Human immunodeficiency virus (HIV) disease ([place]) 10/03/2006    Priority: High  . Hypogonadism male 11/13/2012  . HEMATOCHEZIA 12/27/2009  . HYPERTENSION NEC 12/27/2009  . 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 BACK PAIN, ACUTE 03/10/2008  . CONSTIPATION, CHRONIC 11/03/2007  . 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 DEPRESSION 10/03/2006  . CONJUNCTIVITIS, BILATERAL 10/03/2006  . LYMPHEDEMA 10/03/2006  . GERD 10/03/2006  . HEPATITIS B, HX OF 10/03/2006  . HX, PNEUMONIA 10/03/2006  . HEMATURIA, HX OF 10/03/2006  . CORNEAL TRANSPLANT, RIGHT 10/03/2006    Patient's Medications  New Prescriptions   BICTEGRAVIR-EMTRICITABINE-TENOFOVIR AF (BIKTARVY) 50-200-25 MG TABS TABLET    Take 1 tablet by mouth daily.  Previous Medications   No medications on file  Modified Medications   No medications on file  Discontinued Medications   GENVOYA 150-150-200-10 MG TABS TABLET    TAKE 1 TABLET BY MOUTH DAILY WITH BREAKFAST.    Subjective: Billy [name] is in for Billy [name] routine HIV follow-up visit.  This is Billy [name] first visit since April 2018.  Billy [name] says that Billy [name] did come in for blood work in March of this year but unfortunately it does not seem that it was completed.  Billy [name] says that Billy [name] has had no problems obtaining, taking or tolerating Billy [name] Genvoya and has not missed any doses.  Billy [name] is currently on a three-week fast so Billy [name] changed from taking Billy [name] [place] with breakfast to late evening when Billy [name] breaks Billy [name] fast.  Billy [name] continues to work at [name] but can socially distance from other employees.  Billy [name] is feeling well.  Review of Systems: Review of Systems  Constitutional: Negative for chills, diaphoresis, fever, malaise/fatigue and weight loss.  HENT: Negative for sore throat.   Respiratory: Negative for cough, sputum production and shortness of breath.   Cardiovascular: Negative for chest pain.  Gastrointestinal: Negative for abdominal pain, diarrhea,  heartburn, nausea and vomiting.  Genitourinary: Negative for dysuria and frequency.  Musculoskeletal: Negative for joint pain and myalgias.  Skin: Negative for rash.  Neurological: Negative for dizziness and headaches.  Psychiatric/Behavioral: Negative for depression and substance abuse. The patient is not nervous/anxious.     No past medical history on file.  Social History   Tobacco Use  . Smoking status: Never Smoker  . Smokeless tobacco: Never Used  Substance Use Topics  . Alcohol use: No    Alcohol/week: 0.0 standard drinks  . Drug use: No    No family history on file.  No Known Allergies  Health Maintenance  Topic Date Due  . TETANUS/TDAP  11/13/1981  . COLONOSCOPY  11/13/2012  . INFLUENZA VACCINE  03/14/2019  . Hepatitis C Screening  Completed  . HIV Screening  Completed    Objective:  Vitals:   12/23/18 0919  BP: 138/80  Pulse: (!) 57  Temp: 98 F (36.7 C)  Weight: 201 lb (91.2 kg)  Height: 6\' 2"  (1.88 m)   Body mass index is 25.81 kg/m.  Physical Exam Constitutional:      Comments: Billy [name] is in good spirits.  HENT:     Mouth/Throat:     Pharynx: No oropharyngeal exudate.  Eyes:     Conjunctiva/sclera: Conjunctivae normal.  Cardiovascular:     Rate and Rhythm: Normal rate and regular rhythm.     Heart sounds: No murmur.  Pulmonary:     Effort: Pulmonary effort is normal.     Breath  sounds: Normal breath sounds.  Abdominal:     Palpations: Abdomen is soft. There is no mass.     Tenderness: There is no abdominal tenderness.  Musculoskeletal: Normal range of motion.  Skin:    Findings: No rash.  Neurological:     Mental Status: Billy [name] is alert and oriented to person, place, and time.  Psychiatric:        Mood and Affect: Mood normal.     Lab Results Lab Results  Component Value Date   WBC 3.7 (L) 01/17/2018   HGB 14.4 01/17/2018   HCT 42.3 01/17/2018   MCV 89.4 01/17/2018   PLT 174 01/17/2018    Lab Results  Component Value Date    CREATININE 1.24 01/17/2018   BUN 11 01/17/2018   NA 140 01/17/2018   K 3.9 01/17/2018   CL 104 01/17/2018   CO2 27 01/17/2018    Lab Results  Component Value Date   ALT 17 01/17/2018   AST 22 01/17/2018   ALKPHOS 63 05/23/2015   BILITOT 0.5 01/17/2018    Lab Results  Component Value Date   CHOL 215 (H) 08/28/2016   HDL 37 (L) 08/28/2016   LDLCALC 136 (H) 08/28/2016   TRIG 209 (H) 08/28/2016   CHOLHDL 5.8 (H) 08/28/2016   Lab Results  Component Value Date   LABRPR NON-REACTIVE 01/17/2018   HIV 1 RNA Quant (copies/mL)  Date Value  01/17/2018 33 (H)  08/28/2016 <20 NOT DETECTED  05/23/2015 <20   CD4 T Cell Abs (/uL)  Date Value  01/17/2018 420  08/28/2016 540  05/23/2015 390 (L)     Problem List Items Addressed This Visit      High   Human immunodeficiency virus (HIV) disease ([place])    Billy [name] adherence is excellent and Billy [name] infection has been under very good long-term control.  I will switch Genvoya to [place] so Billy [name] can take it with or without food.  Billy [name] will get blood work today and follow-up in 1 year.      Relevant Medications   bictegravir-emtricitabine-tenofovir AF (BIKTARVY) 50-200-25 MG TABS tablet   Other Relevant Orders   T-helper cell (CD4)- ([facility] clinic only)   HIV-1 RNA quant-no reflex-bld   CBC   Comprehensive metabolic panel   RPR   Lipid panel        Michel Bickers, MD [facility] for Infectious [place] [phone] pager   [phone] cell 12/23/2018, 9:33 AM

## 2018-12-24 LAB — T-HELPER CELL (CD4) - (RCID CLINIC ONLY)
CD4 % Helper T Cell: 32 % — ABNORMAL LOW (ref 33–65)
CD4 T Cell Abs: 443 /uL (ref 400–1790)

## 2018-12-30 LAB — COMPREHENSIVE METABOLIC PANEL
AG Ratio: 1.7 (calc) (ref 1.0–2.5)
ALT: 17 U/L (ref 9–46)
AST: 19 U/L (ref 10–35)
Albumin: 4.4 g/dL (ref 3.6–5.1)
Alkaline phosphatase (APISO): 50 U/L (ref 35–144)
BUN/Creatinine Ratio: 14 (calc) (ref 6–22)
BUN: 20 mg/dL (ref 7–25)
CO2: 29 mmol/L (ref 20–32)
Calcium: 10.3 mg/dL (ref 8.6–10.3)
Chloride: 108 mmol/L (ref 98–110)
Creat: 1.42 mg/dL — ABNORMAL HIGH (ref 0.70–1.33)
Globulin: 2.6 g/dL (calc) (ref 1.9–3.7)
Glucose, Bld: 97 mg/dL (ref 65–99)
Potassium: 4.4 mmol/L (ref 3.5–5.3)
Sodium: 141 mmol/L (ref 135–146)
Total Bilirubin: 0.3 mg/dL (ref 0.2–1.2)
Total Protein: 7 g/dL (ref 6.1–8.1)

## 2018-12-30 LAB — CBC
HCT: 44.2 % (ref 38.5–50.0)
Hemoglobin: 14.6 g/dL (ref 13.2–17.1)
MCH: 30.2 pg (ref 27.0–33.0)
MCHC: 33 g/dL (ref 32.0–36.0)
MCV: 91.5 fL (ref 80.0–100.0)
MPV: 11.1 fL (ref 7.5–12.5)
Platelets: 171 10*3/uL (ref 140–400)
RBC: 4.83 10*6/uL (ref 4.20–5.80)
RDW: 13.3 % (ref 11.0–15.0)
WBC: 3.6 10*3/uL — ABNORMAL LOW (ref 3.8–10.8)

## 2018-12-30 LAB — LIPID PANEL
Cholesterol: 202 mg/dL — ABNORMAL HIGH (ref <200)
HDL: 33 mg/dL — ABNORMAL LOW (ref >=40)
LDL Cholesterol (Calc): 132 mg/dL (calc) — ABNORMAL HIGH
Non-HDL Cholesterol (Calc): 169 mg/dL (calc) — ABNORMAL HIGH (ref <130)
Total CHOL/HDL Ratio: 6.1 (calc) — ABNORMAL HIGH (ref <5.0)
Triglycerides: 229 mg/dL — ABNORMAL HIGH (ref <150)

## 2018-12-30 LAB — RPR: RPR Ser Ql: NONREACTIVE

## 2018-12-30 LAB — HIV-1 RNA QUANT-NO REFLEX-BLD
HIV 1 RNA Quant: 57 copies/mL — ABNORMAL HIGH
HIV-1 RNA Quant, Log: 1.76 Log copies/mL — ABNORMAL HIGH

## 2019-01-25 ENCOUNTER — Emergency Department (HOSPITAL_COMMUNITY)
Admission: EM | Admit: 2019-01-25 | Discharge: 2019-01-25 | Discharge status: Against Medical Advice | Payer: 59 | Attending: Emergency Medicine | Admitting: Emergency Medicine

## 2019-01-25 ENCOUNTER — Encounter (HOSPITAL_COMMUNITY): Payer: Self-pay | Admitting: *Deleted

## 2019-01-25 ENCOUNTER — Ambulatory Visit (INDEPENDENT_AMBULATORY_CARE_PROVIDER_SITE_OTHER)
Admission: EM | Admit: 2019-01-25 | Discharge: 2019-01-25 | Disposition: A | Discharge status: Home / Self Care | Payer: 59 | Source: Home / Self Care

## 2019-01-25 ENCOUNTER — Other Ambulatory Visit: Payer: Self-pay

## 2019-01-25 DIAGNOSIS — Z5321 Procedure and treatment not carried out due to patient leaving prior to being seen by health care provider: Secondary | ICD-10-CM | POA: Diagnosis not present

## 2019-01-25 DIAGNOSIS — T783XXA Angioneurotic edema, initial encounter: Secondary | ICD-10-CM

## 2019-01-25 DIAGNOSIS — T7840XA Allergy, unspecified, initial encounter: Secondary | ICD-10-CM

## 2019-01-25 MED ORDER — METHYLPREDNISOLONE SODIUM SUCC 125 MG IJ SOLR
125.0000 mg | Freq: Once | INTRAMUSCULAR | Status: AC
  Administered 2019-01-25: 125 mg via INTRAVENOUS

## 2019-01-25 MED ORDER — METHYLPREDNISOLONE SODIUM SUCC 125 MG IJ SOLR
INTRAMUSCULAR | Status: AC
  Filled 2019-01-25: qty 2

## 2019-01-25 NOTE — ED Triage Notes (Signed)
Pt states he was stung by a hornet, an hour later, his face became swollen, rash all over, pt states he feels like he cant swallow water.

## 2019-01-25 NOTE — ED Provider Notes (Addendum)
Billy [name] presents with complaints of difficulty swallowing s/p hornet bite two hours ago. He was bit to his hand, which has become red and swollen. Has developed worsening of rash "all over" and now with lip swelling and discomfort to his throat. No difficulty swallowing secretions or with speaking. States he has tried to drink water twice and most recently felt like it came back up and like he will vomit. Denies any known previous hornet bite or known allergic responses in the past. Has taken two benadryl already.    Patient alert, speaking, voice has some hoarseness. Swallowing without difficulty. Swelling to lips noted. Red swollen right hand dorsum. Uvula midline and without swelling. Soft palate without obvious swelling. No wheezing. O2 sats WNL.    Solumedrol 125mg  provided prior to RN transport to ER for further evaluation and monitoring.       Zigmund Gottron, NP 01/25/19 1818

## 2019-01-25 NOTE — ED Triage Notes (Addendum)
Sent here from [facility]. Had bee sting to right hand and then had rash all over, difficulty swallowing and has moderate swelling to right hand. Pt took 2 benadryl at home, received solumedrol 125mg  IM at [facility]. Pt reports swallowing has improved and airway is intact at this time, no resp distress noted.

## 2019-01-25 NOTE — ED Notes (Signed)
Unable to locate pt in the ED lobby or bathroom for pt room x 3

## 2019-01-25 NOTE — ED Notes (Signed)
Patient is being transferred from the Urgent [place] to the Emergency Department via wheelchair by staff. Patient is stable but in need of higher level of care due to allertgic reaction. Patient is aware and verbalizes understanding of plan of care. Sent to ER by natalie Vitals:   01/25/19 1812  BP: (!) 152/99  Pulse: 87  Resp: 16  Temp: 98.8 F (37.1 C)  SpO2: 100%

## 2019-04-06 ENCOUNTER — Encounter (HOSPITAL_COMMUNITY): Payer: Self-pay

## 2019-04-06 ENCOUNTER — Other Ambulatory Visit: Payer: Self-pay

## 2019-04-06 ENCOUNTER — Ambulatory Visit (HOSPITAL_COMMUNITY)
Admission: EM | Admit: 2019-04-06 | Discharge: 2019-04-06 | Disposition: A | Discharge status: Home / Self Care | Payer: 59 | Attending: Emergency Medicine | Admitting: Emergency Medicine

## 2019-04-06 ENCOUNTER — Ambulatory Visit (INDEPENDENT_AMBULATORY_CARE_PROVIDER_SITE_OTHER): Payer: 59

## 2019-04-06 DIAGNOSIS — Z20828 Contact with and (suspected) exposure to other viral communicable diseases: Secondary | ICD-10-CM | POA: Diagnosis not present

## 2019-04-06 DIAGNOSIS — R6889 Other general symptoms and signs: Secondary | ICD-10-CM

## 2019-04-06 DIAGNOSIS — R531 Weakness: Secondary | ICD-10-CM | POA: Diagnosis not present

## 2019-04-06 DIAGNOSIS — Z7902 Long term (current) use of antithrombotics/antiplatelets: Secondary | ICD-10-CM | POA: Diagnosis not present

## 2019-04-06 DIAGNOSIS — R51 Headache: Secondary | ICD-10-CM | POA: Diagnosis not present

## 2019-04-06 DIAGNOSIS — R5383 Other fatigue: Secondary | ICD-10-CM | POA: Diagnosis not present

## 2019-04-06 DIAGNOSIS — I10 Essential (primary) hypertension: Secondary | ICD-10-CM | POA: Diagnosis not present

## 2019-04-06 DIAGNOSIS — J189 Pneumonia, unspecified organism: Secondary | ICD-10-CM | POA: Diagnosis not present

## 2019-04-06 DIAGNOSIS — Z20822 Contact with and (suspected) exposure to covid-19: Secondary | ICD-10-CM

## 2019-04-06 DIAGNOSIS — R05 Cough: Secondary | ICD-10-CM | POA: Diagnosis not present

## 2019-04-06 DIAGNOSIS — Z79899 Other long term (current) drug therapy: Secondary | ICD-10-CM | POA: Diagnosis not present

## 2019-04-06 DIAGNOSIS — Z21 Asymptomatic human immunodeficiency virus [HIV] infection status: Secondary | ICD-10-CM | POA: Insufficient documentation

## 2019-04-06 MED ORDER — HYDROCOD POLST-CPM POLST ER 10-8 MG/5ML PO SUER: 5.0000 mL | Freq: Two times a day (BID) | ORAL | 0 refills | Status: DC | PRN

## 2019-04-06 MED ORDER — AMOXICILLIN-POT CLAVULANATE ER 1000-62.5 MG PO TB12: 2.0000 | ORAL_TABLET | Freq: Two times a day (BID) | ORAL | 0 refills | Status: AC

## 2019-04-06 MED ORDER — BENZONATATE 200 MG PO CAPS: 200.0000 mg | ORAL_CAPSULE | Freq: Three times a day (TID) | ORAL | 0 refills | Status: DC | PRN

## 2019-04-06 NOTE — Discharge Instructions (Addendum)
Your chest x-ray is suggestive for pneumonia, so I am sending you home with Augmentin.  Tessalon to help with the cough during the day, Tussionex for the cough at night.  Stay at home until your COVID test comes back, which should be within 2 or 3 days.  Go immediately to the ER if you get worse.

## 2019-04-06 NOTE — ED Triage Notes (Signed)
Pt cc fatigued  body aches, and  chills x 5 days.

## 2019-04-06 NOTE — ED Provider Notes (Signed)
HPI  SUBJECTIVE:  Billy [name] is a 58 y.o. male who presents with 5 days of body aches, fatigue, weakness, night sweats.  States that he feels feverish, but does not have a thermometer at home.  He reports a dry cough and headache with coughing only.  He reports a 4 to 5 pound unintentional weight loss over the past 5 days due to decreased p.o. intake.  No nasal congestion, rhinorrhea, sore throat, loss of smell or taste, wheezing, chest pain, shortness of breath, hemoptysis, abdominal pain, diarrhea, nausea, vomiting.  No exposure to COVID, TB.  He does not reside in a homeless shelter or was recently incarcerated.  No recent travel.  No antibiotics in the past 3 months.  No antipyretic in the past 4 to 6 hours.  He tried menthol steam, Alka-Seltzer with improvement in symptoms.  No aggravating factors.  He has a past medical history of HIV, viral load is undetectable, hypertension, history of hepatitis B.  No history of TB, asthma, eczema, COPD, smoking.  PMD: Dr. Megan Salon.    History reviewed. No pertinent past medical history.  History reviewed. No pertinent surgical history.  History reviewed. No pertinent family history.  Social History   Tobacco Use  . Smoking status: Never Smoker  . Smokeless tobacco: Never Used  Substance Use Topics  . Alcohol use: No    Alcohol/week: 0.0 standard drinks  . Drug use: No    No current facility-administered medications for this encounter.   Current Outpatient Medications:  .  amoxicillin-clavulanate (AUGMENTIN XR) 1000-62.5 MG 12 hr tablet, Take 2 tablets by mouth 2 (two) times daily for 7 days., Disp: 28 tablet, Rfl: 0 .  benzonatate (TESSALON) 200 MG capsule, Take 1 capsule (200 mg total) by mouth 3 (three) times daily as needed for cough., Disp: 30 capsule, Rfl: 0 .  bictegravir-emtricitabine-tenofovir AF (BIKTARVY) 50-200-25 MG TABS tablet, Take 1 tablet by mouth daily., Disp: 30 tablet, Rfl: 11 .  chlorpheniramine-HYDROcodone  (TUSSIONEX PENNKINETIC ER) 10-8 MG/5ML SUER, Take 5 mLs by mouth every 12 (twelve) hours as needed for cough., Disp: 60 mL, Rfl: 0  No Known Allergies   ROS  As noted in HPI.   Physical Exam  BP 124/77 (BP Location: Right Arm)   Pulse 88   Temp 100 F (37.8 C) (Oral)   Resp 18   Wt 83.9 kg   SpO2 98%   BMI 23.75 kg/m   Constitutional: Well developed, well nourished, no acute distress Eyes: PERRL, EOMI, conjunctiva normal bilaterally HENT: Normocephalic, atraumatic,mucus membranes moist Respiratory: Clear to auscultation bilaterally, no rales, no wheezing, no rhonchi Cardiovascular: Normal rate and rhythm, no murmurs, no gallops, no rubs GI: Soft, nondistended, normal bowel sounds, nontender, no rebound, no guarding skin: No rash, skin intact Musculoskeletal: No edema, no tenderness, no deformities Neurologic: Alert & oriented x 3, CN III-XII grossly intact, no motor deficits, sensation grossly intact Psychiatric: Speech and behavior appropriate   ED Course   Medications - No data to display  Orders Placed This Encounter  Procedures  . Novel Coronavirus, NAA (hospital order; send-out to ref lab)    Standing Status:   Standing    Number of Occurrences:   1    Order Specific Question:   Is this test for diagnosis or screening    Answer:   Screening    Order Specific Question:   Symptomatic for COVID-19 as defined by CDC    Answer:   No    Order Specific Question:  Hospitalized for COVID-19    Answer:   No    Order Specific Question:   Admitted to ICU for COVID-19    Answer:   No    Order Specific Question:   Previously tested for COVID-19    Answer:   No    Order Specific Question:   Resident in a congregate (group) care setting    Answer:   No    Order Specific Question:   Employed in healthcare setting    Answer:   No  . DG Chest 2 View    Standing Status:   Standing    Number of Occurrences:   1    Order Specific Question:   Reason for Exam (SYMPTOM  OR  DIAGNOSIS REQUIRED)    Answer:   dry cough night sweats feverish r/o PNA TB   No results found for this or any previous visit (from the past 24 hour(s)). Dg Chest 2 View  Result Date: 04/06/2019 CLINICAL DATA:  Dry cough, night sweats, fever, rule out pneumonia, TB EXAM: CHEST - 2 VIEW COMPARISON:  None. FINDINGS: The heart size and mediastinal contours are within normal limits. There is irregular heterogeneous opacity, most notable in the left lung base. The visualized skeletal structures are unremarkable. IMPRESSION: There is irregular heterogeneous opacity, most notable in the left lung base, findings concerning for infection. There are no specific features of pulmonary tuberculosis. Electronically Signed   By: Eddie Candle M.D.   On: 04/06/2019 12:57    ED Clinical Impression  1. Community acquired pneumonia, unspecified laterality   2. Suspected Covid-19 Virus Infection      ED Assessment/Plan  Checking chest x-ray rule out pneumonia.  Due to the night sweats, TB in the differential, but that this is less likely as he has no risk factors.  COVID test sent.  Will send home with supportive treatment with Tessalon, Tussionex for severe cough, Tylenol, continue pushing fluids.  Work note for 3 days.  Follow-up with his primary care physician if not better in 3 days, to the ER if he gets worse.  [place] Narcotic database reviewed for this patient, and feel that the risk/benefit ratio today is favorable for proceeding with a prescription for controlled substance.  No opiate prescriptions in 2 years.  Reviewed imaging independently.  Irregular heterogenous opacity concerning for infection.. See radiology report for full details.  Based on x-ray results, will also treat for pneumonia. Augmentin XR 2 g p.o. twice daily for 7 days.  Discussed maging, MDM, treatment plan, and plan for follow-up with patient Discussed sn/sx that should prompt return to the ED. patient agrees with plan.   Meds ordered  this encounter  Medications  . amoxicillin-clavulanate (AUGMENTIN XR) 1000-62.5 MG 12 hr tablet    Sig: Take 2 tablets by mouth 2 (two) times daily for 7 days.    Dispense:  28 tablet    Refill:  0  . chlorpheniramine-HYDROcodone (TUSSIONEX PENNKINETIC ER) 10-8 MG/5ML SUER    Sig: Take 5 mLs by mouth every 12 (twelve) hours as needed for cough.    Dispense:  60 mL    Refill:  0  . benzonatate (TESSALON) 200 MG capsule    Sig: Take 1 capsule (200 mg total) by mouth 3 (three) times daily as needed for cough.    Dispense:  30 capsule    Refill:  0    *This clinic note was created using [employer]. Therefore, there may be occasional mistakes despite careful  proofreading.  ?   Melynda Ripple, MD 04/07/19 1222

## 2019-04-08 ENCOUNTER — Telehealth: Payer: Self-pay

## 2019-04-08 LAB — NOVEL CORONAVIRUS, NAA (HOSP ORDER, SEND-OUT TO REF LAB; TAT 18-24 HRS): SARS-CoV-2, NAA: NOT DETECTED

## 2019-04-08 NOTE — Telephone Encounter (Signed)
Thanks for letting me know!

## 2019-04-08 NOTE — Telephone Encounter (Signed)
Patient called to inform Dr. Megan Salon that he was recently seen in the ED for Diagnosis of Pneumonia and was prescribed short term antibiotics. Patient states his cough has improved, his appetite is starting to get better day by day. LPN advised patient that he should continue to drink plenty of fluids and eat small meals to gradually get his appetite back to normal. Also advised patient to seek PCP. Patient was appreciative of advise. Routing to MD to make aware of recent ED visit.  Eugenia Mcalpine

## 2019-12-28 ENCOUNTER — Telehealth: Payer: Self-pay | Admitting: Pharmacy Technician

## 2019-12-28 NOTE — Telephone Encounter (Signed)
[facility] Patient Advocate Encounter   Patient will bring in proof of income documents for possible approval for copay assistance through [employer].  I will continue to follow.  Billy [name]. Billy [name] Patient [facility] for [facility] Phone: [phone] Fax:  [phone]

## 2019-12-31 ENCOUNTER — Other Ambulatory Visit: Payer: Self-pay | Admitting: Pharmacist

## 2019-12-31 ENCOUNTER — Telehealth: Payer: Self-pay | Admitting: Pharmacy Technician

## 2019-12-31 DIAGNOSIS — B20 Human immunodeficiency virus [HIV] disease: Secondary | ICD-10-CM

## 2019-12-31 MED ORDER — BICTEGRAVIR-EMTRICITAB-TENOFOV 50-200-25 MG PO TABS: 1.0000 | ORAL_TABLET | Freq: Every day | ORAL | 11 refills | Status: DC

## 2019-12-31 NOTE — Telephone Encounter (Addendum)
[facility] Patient Advocate Encounter   I was successful in securing patient a $7500 grant from [employer] (PA[employer]) to provide copayment coverage for Biktarvy. This will make the out of pocket cost $0.     I have spoken with the patient.    The billing information is  Dates of Eligibility: 12/31/2019 through 12/30/2020  Patient knows to call the office with questions or concerns. He went by the pharmacy on Thursday and they were trying to charge him $900. I called them this morning and [facility]. Now $0 and patient will pick up today.   Bartholomew Crews, CPhT Specialty Pharmacy Patient [facility] for Infectious Disease Phone: [phone] Fax: [phone] 12/31/2019 10:16 AM

## 2019-12-31 NOTE — Progress Notes (Signed)
[facility] as he only has one pill remaining and mail order is late this month.

## 2020-01-01 ENCOUNTER — Other Ambulatory Visit: Payer: Self-pay | Admitting: Internal Medicine

## 2020-01-01 DIAGNOSIS — B20 Human immunodeficiency virus [HIV] disease: Secondary | ICD-10-CM

## 2020-01-04 ENCOUNTER — Other Ambulatory Visit: Payer: Self-pay | Admitting: Pharmacist

## 2020-01-04 DIAGNOSIS — B20 Human immunodeficiency virus [HIV] disease: Secondary | ICD-10-CM

## 2020-01-04 NOTE — Telephone Encounter (Signed)
What in the world.   I [facility] but will [facility]. No worries.

## 2020-01-05 ENCOUNTER — Other Ambulatory Visit: Payer: 59

## 2020-01-18 ENCOUNTER — Other Ambulatory Visit: Payer: Self-pay

## 2020-01-18 ENCOUNTER — Other Ambulatory Visit (HOSPITAL_COMMUNITY)
Admission: RE | Admit: 2020-01-18 | Discharge: 2020-01-18 | Disposition: A | Discharge status: Home / Self Care | Payer: 59 | Source: Ambulatory Visit | Attending: Internal Medicine | Admitting: Internal Medicine

## 2020-01-18 ENCOUNTER — Other Ambulatory Visit: Payer: 59

## 2020-01-18 ENCOUNTER — Encounter: Payer: 59 | Admitting: Internal Medicine

## 2020-01-18 DIAGNOSIS — Z79899 Other long term (current) drug therapy: Secondary | ICD-10-CM | POA: Diagnosis present

## 2020-01-18 DIAGNOSIS — B20 Human immunodeficiency virus [HIV] disease: Secondary | ICD-10-CM

## 2020-01-18 DIAGNOSIS — K5909 Other constipation: Secondary | ICD-10-CM

## 2020-01-18 DIAGNOSIS — Z113 Encounter for screening for infections with a predominantly sexual mode of transmission: Secondary | ICD-10-CM

## 2020-01-19 LAB — URINE CYTOLOGY ANCILLARY ONLY
Chlamydia: NEGATIVE
Comment: NEGATIVE
Comment: NORMAL
Neisseria Gonorrhea: NEGATIVE

## 2020-01-19 LAB — T-HELPER CELL (CD4) - (RCID CLINIC ONLY)
CD4 % Helper T Cell: 34 % (ref 33–65)
CD4 T Cell Abs: 618 /uL (ref 400–1790)

## 2020-01-20 LAB — CBC WITH DIFFERENTIAL/PLATELET
Absolute Monocytes: 428 cells/uL (ref 200–950)
Basophils Absolute: 20 cells/uL (ref 0–200)
Basophils Relative: 0.4 %
Eosinophils Absolute: 102 cells/uL (ref 15–500)
Eosinophils Relative: 2 %
HCT: 44.6 % (ref 38.5–50.0)
Hemoglobin: 14.8 g/dL (ref 13.2–17.1)
Lymphs Abs: 1862 cells/uL (ref 850–3900)
MCH: 30.1 pg (ref 27.0–33.0)
MCHC: 33.2 g/dL (ref 32.0–36.0)
MCV: 90.8 fL (ref 80.0–100.0)
MPV: 10.8 fL (ref 7.5–12.5)
Monocytes Relative: 8.4 %
Neutro Abs: 2688 cells/uL (ref 1500–7800)
Neutrophils Relative %: 52.7 %
Platelets: 180 10*3/uL (ref 140–400)
RBC: 4.91 10*6/uL (ref 4.20–5.80)
RDW: 13.6 % (ref 11.0–15.0)
Total Lymphocyte: 36.5 %
WBC: 5.1 10*3/uL (ref 3.8–10.8)

## 2020-01-20 LAB — COMPLETE METABOLIC PANEL WITH GFR
AG Ratio: 1.9 (calc) (ref 1.0–2.5)
ALT: 18 U/L (ref 9–46)
AST: 23 U/L (ref 10–35)
Albumin: 4.6 g/dL (ref 3.6–5.1)
Alkaline phosphatase (APISO): 51 U/L (ref 35–144)
BUN/Creatinine Ratio: 13 (calc) (ref 6–22)
BUN: 18 mg/dL (ref 7–25)
CO2: 27 mmol/L (ref 20–32)
Calcium: 10.4 mg/dL — ABNORMAL HIGH (ref 8.6–10.3)
Chloride: 107 mmol/L (ref 98–110)
Creat: 1.36 mg/dL — ABNORMAL HIGH (ref 0.70–1.33)
GFR, Est African American: 66 mL/min/{1.73_m2} (ref >=60)
GFR, Est Non African American: 57 mL/min/{1.73_m2} — ABNORMAL LOW (ref >=60)
Globulin: 2.4 g/dL (calc) (ref 1.9–3.7)
Glucose, Bld: 84 mg/dL (ref 65–99)
Potassium: 4.5 mmol/L (ref 3.5–5.3)
Sodium: 141 mmol/L (ref 135–146)
Total Bilirubin: 0.5 mg/dL (ref 0.2–1.2)
Total Protein: 7 g/dL (ref 6.1–8.1)

## 2020-01-20 LAB — RPR: RPR Ser Ql: NONREACTIVE

## 2020-01-20 LAB — HIV-1 RNA QUANT-NO REFLEX-BLD
HIV 1 RNA Quant: <20 copies/mL
HIV-1 RNA Quant, Log: <1.3 Log copies/mL

## 2020-02-22 ENCOUNTER — Ambulatory Visit (INDEPENDENT_AMBULATORY_CARE_PROVIDER_SITE_OTHER): Payer: 59 | Admitting: Internal Medicine

## 2020-02-22 ENCOUNTER — Encounter: Payer: Self-pay | Admitting: Internal Medicine

## 2020-02-22 ENCOUNTER — Other Ambulatory Visit: Payer: Self-pay

## 2020-02-22 DIAGNOSIS — B20 Human immunodeficiency virus [HIV] disease: Secondary | ICD-10-CM

## 2020-02-22 MED ORDER — BICTEGRAVIR-EMTRICITAB-TENOFOV 50-200-25 MG PO TABS: 1.0000 | ORAL_TABLET | Freq: Every day | ORAL | 11 refills | Status: DC

## 2020-02-22 NOTE — Assessment & Plan Note (Signed)
His infection has been under excellent, long-term control.  He will continue Biktarvy and follow-up after lab work in 1 year.

## 2020-02-22 NOTE — Progress Notes (Signed)
Patient Active Problem List   Diagnosis Date Noted  . Human immunodeficiency virus (HIV) disease ([place]) 10/03/2006    Priority: High  . Hypogonadism male 11/13/2012  . HEMATOCHEZIA 12/27/2009  . HYPERTENSION NEC 12/27/2009  . 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 BACK PAIN, ACUTE 03/10/2008  . CONSTIPATION, CHRONIC 11/03/2007  . [place] SARCOMA 10/03/2006  . DEPRESSION 10/03/2006  . CONJUNCTIVITIS, BILATERAL 10/03/2006  . LYMPHEDEMA 10/03/2006  . GERD 10/03/2006  . HEPATITIS B, HX OF 10/03/2006  . HX, PNEUMONIA 10/03/2006  . HEMATURIA, HX OF 10/03/2006  . CORNEAL TRANSPLANT, RIGHT 10/03/2006    Patient's Medications  New Prescriptions   No medications on file  Previous Medications   No medications on file  Modified Medications   Modified Medication Previous Medication   BICTEGRAVIR-EMTRICITABINE-TENOFOVIR AF (BIKTARVY) 50-200-25 MG TABS TABLET bictegravir-emtricitabine-tenofovir AF (BIKTARVY) 50-200-25 MG TABS tablet      Take 1 tablet by mouth daily.    Take 1 tablet by mouth daily.  Discontinued Medications   BENZONATATE (TESSALON) 200 MG CAPSULE    Take 1 capsule (200 mg total) by mouth 3 (three) times daily as needed for cough.   CHLORPHENIRAMINE-HYDROCODONE (TUSSIONEX PENNKINETIC ER) 10-8 MG/5ML SUER    Take 5 mLs by mouth every 12 (twelve) hours as needed for cough.    Subjective: Billy [name] is in for his routine HIV follow-up visit.  He was recently out of his [place] for about 1 month when his pharmacy said that he had already used up his co-pay support for the year.  Fortunately he met with our pharmacy staff and they got the issue worked out.  He recently started back on Biktarvy.  When he has had his medication he has not missed any doses.  He has already received his Pfizer Covid vaccine.  He is feeling well.  Review of Systems: Review of Systems  Constitutional: Negative for fever and weight loss.  Respiratory: Negative for cough and  shortness of breath.   Cardiovascular: Negative for chest pain.  Gastrointestinal: Negative for nausea and vomiting.    No past medical history on file.  Social History   Tobacco Use  . Smoking status: Never Smoker  . Smokeless tobacco: Never Used  Substance Use Topics  . Alcohol use: No    Alcohol/week: 0.0 standard drinks    Comment: every now and then  . Drug use: No    No family history on file.  No Known Allergies  Health Maintenance  Topic Date Due  . COVID-19 Vaccine (1) Never done  . TETANUS/TDAP  Never done  . COLONOSCOPY  Never done  . INFLUENZA VACCINE  03/13/2020  . Hepatitis C Screening  Completed  . HIV Screening  Completed    Objective:  Vitals:   02/22/20 1629  BP: 122/78  Pulse: 72  Temp: 98.2 F (36.8 C)  TempSrc: Oral  Weight: 206 lb (93.4 kg)  Height: _0  (1.88 m)   Body mass index is 26.45 kg/m.  Physical Exam Constitutional:      Comments: He is in good spirits.  Cardiovascular:     Rate and Rhythm: Normal rate and regular rhythm.     Heart sounds: No murmur heard.   Pulmonary:     Effort: Pulmonary effort is normal.     Breath sounds: Normal breath sounds.  Psychiatric:        Mood and Affect: Mood normal.     Lab Results Lab Results  Component Value Date   WBC 5.1 01/18/2020   HGB 14.8 01/18/2020   HCT 44.6 01/18/2020   MCV 90.8 01/18/2020   PLT 180 01/18/2020    Lab Results  Component Value Date   CREATININE 1.36 (H) 01/18/2020   BUN 18 01/18/2020   NA 141 01/18/2020   K 4.5 01/18/2020   CL 107 01/18/2020   CO2 27 01/18/2020    Lab Results  Component Value Date   ALT 18 01/18/2020   AST 23 01/18/2020   ALKPHOS 63 05/23/2015   BILITOT 0.5 01/18/2020    Lab Results  Component Value Date   CHOL 202 (H) 12/23/2018   HDL 33 (L) 12/23/2018   LDLCALC 132 (H) 12/23/2018   TRIG 229 (H) 12/23/2018   CHOLHDL 6.1 (H) 12/23/2018   Lab Results  Component Value Date   LABRPR NON-REACTIVE 01/18/2020   HIV 1  RNA Quant (copies/mL)  Date Value  01/18/2020 <20 NOT DETECTED  12/23/2018 57 (H)  01/17/2018 33 (H)   CD4 T Cell Abs (/uL)  Date Value  01/18/2020 618  12/23/2018 443  01/17/2018 420     Problem List Items Addressed This Visit      High   Human immunodeficiency virus (HIV) disease ([place])    His infection has been under excellent, long-term control.  He will continue Biktarvy and follow-up after lab work in 1 year.      Relevant Medications   bictegravir-emtricitabine-tenofovir AF (BIKTARVY) 50-200-25 MG TABS tablet   Other Relevant Orders   CBC   T-helper cell (CD4)- ([facility] clinic only)   Comprehensive metabolic panel   Lipid panel   RPR   HIV-1 RNA quant-no reflex-bld        Michel Bickers, MD [facility] [place] [phone] pager   [phone] cell 02/22/2020, 5:16 PM

## 2021-02-14 ENCOUNTER — Other Ambulatory Visit: Payer: 59

## 2021-02-17 ENCOUNTER — Other Ambulatory Visit: Payer: Self-pay

## 2021-02-17 ENCOUNTER — Other Ambulatory Visit: Payer: 59

## 2021-02-17 ENCOUNTER — Other Ambulatory Visit: Payer: Self-pay | Admitting: Internal Medicine

## 2021-02-17 DIAGNOSIS — B20 Human immunodeficiency virus [HIV] disease: Secondary | ICD-10-CM

## 2021-02-20 LAB — COMPREHENSIVE METABOLIC PANEL
AG Ratio: 1.6 (calc) (ref 1.0–2.5)
ALT: 17 U/L (ref 9–46)
AST: 21 U/L (ref 10–35)
Albumin: 4.2 g/dL (ref 3.6–5.1)
Alkaline phosphatase (APISO): 63 U/L (ref 35–144)
BUN/Creatinine Ratio: 14 (calc) (ref 6–22)
BUN: 19 mg/dL (ref 7–25)
CO2: 29 mmol/L (ref 20–32)
Calcium: 10.3 mg/dL (ref 8.6–10.3)
Chloride: 105 mmol/L (ref 98–110)
Creat: 1.35 mg/dL — ABNORMAL HIGH (ref 0.70–1.33)
Globulin: 2.6 g/dL (calc) (ref 1.9–3.7)
Glucose, Bld: 86 mg/dL (ref 65–99)
Potassium: 4.3 mmol/L (ref 3.5–5.3)
Sodium: 142 mmol/L (ref 135–146)
Total Bilirubin: 0.5 mg/dL (ref 0.2–1.2)
Total Protein: 6.8 g/dL (ref 6.1–8.1)

## 2021-02-20 LAB — CBC
HCT: 43.8 % (ref 38.5–50.0)
Hemoglobin: 14.5 g/dL (ref 13.2–17.1)
MCH: 30.4 pg (ref 27.0–33.0)
MCHC: 33.1 g/dL (ref 32.0–36.0)
MCV: 91.8 fL (ref 80.0–100.0)
MPV: 10.1 fL (ref 7.5–12.5)
Platelets: 241 10*3/uL (ref 140–400)
RBC: 4.77 10*6/uL (ref 4.20–5.80)
RDW: 13.1 % (ref 11.0–15.0)
WBC: 7 10*3/uL (ref 3.8–10.8)

## 2021-02-20 LAB — LIPID PANEL
Cholesterol: 148 mg/dL (ref <200)
HDL: 32 mg/dL — ABNORMAL LOW (ref >=40)
LDL Cholesterol (Calc): 96 mg/dL (calc)
Non-HDL Cholesterol (Calc): 116 mg/dL (calc) (ref <130)
Total CHOL/HDL Ratio: 4.6 (calc) (ref <5.0)
Triglycerides: 104 mg/dL (ref <150)

## 2021-02-20 LAB — T-HELPER CELLS (CD4) COUNT (NOT AT ARMC)
Absolute CD4: 439 cells/uL — ABNORMAL LOW (ref 490–1740)
CD4 T Helper %: 40 % (ref 30–61)
Total lymphocyte count: 1092 cells/uL (ref 850–3900)

## 2021-02-20 LAB — HIV-1 RNA QUANT-NO REFLEX-BLD
HIV 1 RNA Quant: NOT DETECTED Copies/mL
HIV-1 RNA Quant, Log: NOT DETECTED Log cps/mL

## 2021-02-20 LAB — RPR: RPR Ser Ql: NONREACTIVE

## 2021-02-28 ENCOUNTER — Other Ambulatory Visit: Payer: Self-pay

## 2021-02-28 ENCOUNTER — Ambulatory Visit (INDEPENDENT_AMBULATORY_CARE_PROVIDER_SITE_OTHER): Payer: 59 | Admitting: Internal Medicine

## 2021-02-28 ENCOUNTER — Encounter: Payer: Self-pay | Admitting: Internal Medicine

## 2021-02-28 DIAGNOSIS — I872 Venous insufficiency (chronic) (peripheral): Secondary | ICD-10-CM | POA: Insufficient documentation

## 2021-02-28 DIAGNOSIS — B20 Human immunodeficiency virus [HIV] disease: Secondary | ICD-10-CM | POA: Diagnosis not present

## 2021-02-28 NOTE — Assessment & Plan Note (Signed)
He has chronic venous insufficiency with some recent exacerbation.  He wears knee-high socks but may benefit from compressive stockings.  We will help him find a new PCP

## 2021-02-28 NOTE — Assessment & Plan Note (Signed)
His infection remains under excellent, long-term control.  He will continue Biktarvy and follow-up after lab work in 1 year.  I encouraged him to get a COVID booster vaccine.

## 2021-02-28 NOTE — Progress Notes (Signed)
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  Modified Medications   No medications on file  Discontinued Medications   No medications on file    Subjective: Billy [name] is in for his routine HIV follow-up visit.  He denies any problems obtaining, taking or tolerating his Biktarvy.  He recalls missing 1 dose in January.  He completed his first 2 COVID vaccines but has not had a booster.  He tells me that his PCP had moved to [place] about a year and a half ago.  He says that he is feeling well other than noting some increased swelling in both legs.  Review of Systems: Review of Systems  Constitutional:  Negative for chills, diaphoresis, fever, malaise/fatigue and weight loss.  Respiratory:  Negative for cough and shortness of breath.   Cardiovascular:  Negative for chest pain.  Gastrointestinal:  Negative for abdominal pain, diarrhea, nausea and vomiting.  Genitourinary:  Negative for dysuria.       Nocturia x2.  Musculoskeletal:        Increased swelling of both legs and feet recently.  Psychiatric/Behavioral:  Negative for depression.    No past medical history on file.  Social History   Tobacco Use   Smoking  status: Never   Smokeless tobacco: Never  Substance Use Topics   Alcohol use: No    Alcohol/week: 0.0 standard drinks    Comment: every now and then   Drug use: No    No family history on file.  No Known Allergies  Health Maintenance  Topic Date Due   COVID-19 Vaccine (1) Never done   TETANUS/TDAP  Never done   Zoster Vaccines- Shingrix (1 of 2) Never done   COLONOSCOPY (Pts 45-31yrs Insurance coverage will need to be confirmed)  Never done   Pneumococcal Vaccine 13-80 Years old (3 - PCV) 11/09/2009   INFLUENZA VACCINE  03/13/2021   Hepatitis C Screening  Completed   HIV Screening  Completed   HPV VACCINES  Aged Out    Objective:  Vitals:   02/28/21 1611  BP: 119/78  Pulse: 72  Temp: 98.4 F (36.9 C)  TempSrc: Oral  Weight: 202 lb (91.6 kg)   Body mass index is 25.94 kg/m.  Physical Exam Constitutional:      Comments: His spirits are good.  Cardiovascular:     Rate and Rhythm: Normal rate and regular rhythm.     Heart sounds: No murmur heard. Pulmonary:     Effort: Pulmonary effort is normal.     Breath sounds: Normal breath sounds.  Abdominal:     Palpations: Abdomen is soft.     Tenderness: There is no abdominal tenderness.  Musculoskeletal:     Right lower leg:  Edema present.     Left lower leg: Edema present.     Comments: He has pitting edema of both legs to the level of the knees.  He has some hyperpigmentation over the shins compatible with chronic venous stasis dermatitis.  He has no open ulcers.  Psychiatric:        Mood and Affect: Mood normal.    Lab Results Lab Results  Component Value Date   WBC 7.0 02/17/2021   HGB 14.5 02/17/2021   HCT 43.8 02/17/2021   MCV 91.8 02/17/2021   PLT 241 02/17/2021    Lab Results  Component Value Date   CREATININE 1.35 (H) 02/17/2021   BUN 19 02/17/2021   NA 142 02/17/2021   K 4.3 02/17/2021   CL 105 02/17/2021   CO2 29 02/17/2021    Lab Results  Component Value Date   ALT 17 02/17/2021   AST  21 02/17/2021   ALKPHOS 63 05/23/2015   BILITOT 0.5 02/17/2021    Lab Results  Component Value Date   CHOL 148 02/17/2021   HDL 32 (L) 02/17/2021   LDLCALC 96 02/17/2021   TRIG 104 02/17/2021   CHOLHDL 4.6 02/17/2021   Lab Results  Component Value Date   LABRPR NON-REACTIVE 02/17/2021   HIV 1 RNA Quant  Date Value  02/17/2021 Not Detected Copies/mL  01/18/2020 <20 NOT DETECTED copies/mL  12/23/2018 57 copies/mL (H)   CD4 T Cell Abs (/uL)  Date Value  01/18/2020 618  12/23/2018 443  01/17/2018 420     Problem List Items Addressed This Visit       High   Human immunodeficiency virus (HIV) disease ([place])    His infection remains under excellent, long-term control.  He will continue Biktarvy and follow-up after lab work in 1 year.  I encouraged him to get a COVID booster vaccine.       Relevant Orders   CBC   T-helper cell (CD4)- ([facility] clinic only)   Comprehensive metabolic panel   Lipid panel   RPR   HIV-1 RNA quant-no reflex-bld     Unprioritized   Venous insufficiency of both lower extremities    He has chronic venous insufficiency with some recent exacerbation.  He wears knee-high socks but may benefit from compressive stockings.  We will help him find a new PCP          Michel Bickers, MD [facility] for Infectious [place] [phone] pager   [phone] cell 02/28/2021, 4:36 PM

## 2021-03-14 ENCOUNTER — Other Ambulatory Visit: Payer: Self-pay | Admitting: Internal Medicine

## 2021-03-14 DIAGNOSIS — B20 Human immunodeficiency virus [HIV] disease: Secondary | ICD-10-CM

## 2021-03-16 ENCOUNTER — Telehealth: Payer: Self-pay

## 2021-03-16 NOTE — Telephone Encounter (Signed)
RN faxed signed diagnosis verification form to [employer] copay relief.   Beryle Flock, RN

## 2021-03-16 NOTE — Telephone Encounter (Signed)
Received voicemail from [employer] copay relief stating they have not received diagnosis verification form from provider and have now closed the patient's account. They sent the form to an incorrect fax number.   RN called Candy at [facility] and she states she will re-fax the form to triage.   PAF Phone: [phone] PAF Fax: [phone]   Beryle Flock, RN

## 2021-04-14 ENCOUNTER — Other Ambulatory Visit: Payer: Self-pay | Admitting: Internal Medicine

## 2021-04-14 DIAGNOSIS — B20 Human immunodeficiency virus [HIV] disease: Secondary | ICD-10-CM

## 2021-09-21 ENCOUNTER — Other Ambulatory Visit: Payer: Self-pay | Admitting: Urology

## 2021-09-21 DIAGNOSIS — R972 Elevated prostate specific antigen [PSA]: Secondary | ICD-10-CM

## 2021-11-03 ENCOUNTER — Ambulatory Visit
Admission: RE | Admit: 2021-11-03 | Discharge: 2021-11-03 | Disposition: A | Discharge status: Home / Self Care | Payer: 59 | Source: Ambulatory Visit | Attending: Urology | Admitting: Urology

## 2021-11-03 ENCOUNTER — Other Ambulatory Visit: Payer: Self-pay

## 2021-11-03 DIAGNOSIS — R972 Elevated prostate specific antigen [PSA]: Secondary | ICD-10-CM

## 2021-11-03 MED ORDER — GADOBENATE DIMEGLUMINE 529 MG/ML IV SOLN
19.0000 mL | Freq: Once | INTRAVENOUS | Status: AC | PRN
  Administered 2021-11-03: 19 mL via INTRAVENOUS

## 2022-03-06 ENCOUNTER — Ambulatory Visit: Payer: 59 | Admitting: Internal Medicine

## 2022-03-09 ENCOUNTER — Other Ambulatory Visit: Payer: Self-pay

## 2022-03-09 ENCOUNTER — Other Ambulatory Visit: Payer: 59

## 2022-03-09 DIAGNOSIS — Z113 Encounter for screening for infections with a predominantly sexual mode of transmission: Secondary | ICD-10-CM

## 2022-03-09 DIAGNOSIS — Z79899 Other long term (current) drug therapy: Secondary | ICD-10-CM

## 2022-03-09 DIAGNOSIS — B20 Human immunodeficiency virus [HIV] disease: Secondary | ICD-10-CM

## 2022-03-22 ENCOUNTER — Ambulatory Visit (INDEPENDENT_AMBULATORY_CARE_PROVIDER_SITE_OTHER): Payer: 59 | Admitting: Internal Medicine

## 2022-03-22 ENCOUNTER — Other Ambulatory Visit: Payer: Self-pay

## 2022-03-22 ENCOUNTER — Encounter: Payer: Self-pay | Admitting: Internal Medicine

## 2022-03-22 DIAGNOSIS — B20 Human immunodeficiency virus [HIV] disease: Secondary | ICD-10-CM

## 2022-03-22 DIAGNOSIS — N400 Enlarged prostate without lower urinary tract symptoms: Secondary | ICD-10-CM | POA: Diagnosis not present

## 2022-03-22 MED ORDER — BIKTARVY 50-200-25 MG PO TABS: 1.0000 | ORAL_TABLET | Freq: Every day | ORAL | 11 refills | Status: DC

## 2022-03-22 NOTE — Assessment & Plan Note (Signed)
His infection has been under excellent, long-term control.  He will get repeat blood work today, continue Air[employer] and follow-up in 1 year.

## 2022-03-22 NOTE — Assessment & Plan Note (Signed)
I showed him how to sign up for MyChart and asked him to send me the name of the medication he has been started on.

## 2022-03-22 NOTE — Progress Notes (Signed)
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    TAKE 1 TABLET BY MOUTH EVERY DAY  Discontinued Medications   No medications on file    Subjective: Billy [name] is in for his routine HIV follow-up visit.  He has not had any problems obtaining, taking or tolerating his Biktarvy.  He rarely misses.  He did miss 2 doses last week when he went out of town on short notice and forgot to take his [place] with him.  He has a new PCP, Dr. Joyce Gross.  When he saw Dr. Radford Pax in January he was found to have a elevated PSA of 14.46.  He eventually underwent a biopsy of his prostate at [facility] urology and was told that there was no evidence of cancer.  He was started on a medication to help shrink his prostate but he does not know the name of the medication.  He is feeling well.  Review of Systems: Review of Systems  Constitutional:  Negative for fever and weight loss.  Genitourinary:  Negative for dysuria and urgency.  Psychiatric/Behavioral:  Negative for  depression.     No past medical history on file.  Social History   Tobacco Use   Smoking status: Never   Smokeless tobacco: Never  Substance Use Topics   Alcohol use: No    Alcohol/week: 0.0 standard drinks of alcohol    Comment: every now and then   Drug use: No    No family history on file.  No Known Allergies  Health Maintenance  Topic Date Due   COVID-19 Vaccine (1) Never done   TETANUS/TDAP  Never done   Zoster Vaccines- Shingrix (1 of 2) Never done   COLONOSCOPY (Pts 45-44yr Insurance coverage will need to be confirmed)  Never done   INFLUENZA VACCINE  03/13/2022   Hepatitis C Screening  Completed   HIV Screening  Completed   HPV VACCINES  Aged Out    Objective:  Vitals:   03/22/22 1540  BP: 133/80  Pulse: 63  Temp: 98.2 F (36.8 C)  TempSrc: Oral  SpO2: 98%  Weight: 209 lb (94.8 kg)   Body mass index is 26.83 kg/m.  Physical Exam Constitutional:      Comments: He is calm and pleasant as usual.  Cardiovascular:     Rate and Rhythm: Normal rate.  Pulmonary:     Effort: Pulmonary effort is normal.  Psychiatric:        Mood and Affect: Mood normal.     Lab Results Lab Results  Component Value Date  WBC 7.0 02/17/2021   HGB 14.5 02/17/2021   HCT 43.8 02/17/2021   MCV 91.8 02/17/2021   PLT 241 02/17/2021    Lab Results  Component Value Date   CREATININE 1.35 (H) 02/17/2021   BUN 19 02/17/2021   NA 142 02/17/2021   K 4.3 02/17/2021   CL 105 02/17/2021   CO2 29 02/17/2021    Lab Results  Component Value Date   ALT 17 02/17/2021   AST 21 02/17/2021   ALKPHOS 63 05/23/2015   BILITOT 0.5 02/17/2021    Lab Results  Component Value Date   CHOL 148 02/17/2021   HDL 32 (L) 02/17/2021   LDLCALC 96 02/17/2021   TRIG 104 02/17/2021   CHOLHDL 4.6 02/17/2021   Lab Results  Component Value Date   LABRPR NON-REACTIVE 02/17/2021   HIV 1 RNA Quant  Date Value  02/17/2021 Not Detected Copies/mL  01/18/2020 <20 NOT DETECTED copies/mL   12/23/2018 57 copies/mL (H)   CD4 T Cell Abs (/uL)  Date Value  01/18/2020 618  12/23/2018 443  01/17/2018 420     Problem List Items Addressed This Visit       High   Human immunodeficiency virus (HIV) disease ([place])    His infection has been under excellent, long-term control.  He will get repeat blood work today, continue [employer] and follow-up in 1 year.      Relevant Medications   bictegravir-emtricitabine-tenofovir AF (BIKTARVY) 50-200-25 MG TABS tablet   Other Relevant Orders   T-helper cells (CD4) count (not at [facility])   HIV-1 RNA quant-no reflex-bld   CBC   Comprehensive metabolic panel   RPR   Lipid panel     Unprioritized   BPH (benign prostatic hyperplasia)    I showed him how to sign up for MyChart and asked him to send me the name of the medication he has been started on.         Michel Bickers, MD [facility] [phone] pager   [phone] cell 03/22/2022, 4:18 PM

## 2022-03-23 LAB — T-HELPER CELLS (CD4) COUNT (NOT AT ARMC)
CD4 % Helper T Cell: 37 % (ref 33–65)
CD4 T Cell Abs: 564 /uL (ref 400–1790)

## 2022-03-26 LAB — COMPREHENSIVE METABOLIC PANEL WITH GFR
AG Ratio: 2 (calc) (ref 1.0–2.5)
ALT: 16 U/L (ref 9–46)
AST: 20 U/L (ref 10–35)
Albumin: 4.7 g/dL (ref 3.6–5.1)
Alkaline phosphatase (APISO): 49 U/L (ref 35–144)
BUN/Creatinine Ratio: 13 (calc) (ref 6–22)
BUN: 19 mg/dL (ref 7–25)
CO2: 26 mmol/L (ref 20–32)
Calcium: 10.3 mg/dL (ref 8.6–10.3)
Chloride: 106 mmol/L (ref 98–110)
Creat: 1.48 mg/dL — ABNORMAL HIGH (ref 0.70–1.30)
Globulin: 2.4 g/dL (ref 1.9–3.7)
Glucose, Bld: 91 mg/dL (ref 65–99)
Potassium: 4.6 mmol/L (ref 3.5–5.3)
Sodium: 138 mmol/L (ref 135–146)
Total Bilirubin: 0.5 mg/dL (ref 0.2–1.2)
Total Protein: 7.1 g/dL (ref 6.1–8.1)

## 2022-03-26 LAB — SYPHILIS: RPR W/REFLEX TO RPR TITER AND TREPONEMAL ANTIBODIES, TRADITIONAL SCREENING AND DIAGNOSIS ALGORITHM: RPR Ser Ql: NONREACTIVE

## 2022-03-26 LAB — CBC
HCT: 45.1 % (ref 38.5–50.0)
Hemoglobin: 15.3 g/dL (ref 13.2–17.1)
MCH: 31.1 pg (ref 27.0–33.0)
MCHC: 33.9 g/dL (ref 32.0–36.0)
MCV: 91.7 fL (ref 80.0–100.0)
MPV: 10.2 fL (ref 7.5–12.5)
Platelets: 215 10*3/uL (ref 140–400)
RBC: 4.92 10*6/uL (ref 4.20–5.80)
RDW: 13.2 % (ref 11.0–15.0)
WBC: 3.7 10*3/uL — ABNORMAL LOW (ref 3.8–10.8)

## 2022-03-26 LAB — LIPID PANEL
Cholesterol: 182 mg/dL (ref <200)
HDL: 33 mg/dL — ABNORMAL LOW (ref >=40)
LDL Cholesterol (Calc): 116 mg/dL — ABNORMAL HIGH
Non-HDL Cholesterol (Calc): 149 mg/dL — ABNORMAL HIGH (ref <130)
Total CHOL/HDL Ratio: 5.5 (calc) — ABNORMAL HIGH (ref <5.0)
Triglycerides: 210 mg/dL — ABNORMAL HIGH (ref <150)

## 2022-03-26 LAB — HIV-1 RNA QUANT-NO REFLEX-BLD
HIV 1 RNA Quant: NOT DETECTED Copies/mL
HIV-1 RNA Quant, Log: NOT DETECTED Log cps/mL

## 2022-06-03 ENCOUNTER — Other Ambulatory Visit: Payer: Self-pay | Admitting: Internal Medicine

## 2022-06-03 DIAGNOSIS — B20 Human immunodeficiency virus [HIV] disease: Secondary | ICD-10-CM

## 2022-06-14 ENCOUNTER — Other Ambulatory Visit (HOSPITAL_COMMUNITY): Payer: Self-pay

## 2022-09-18 ENCOUNTER — Other Ambulatory Visit: Payer: Self-pay | Admitting: Internal Medicine

## 2022-09-18 DIAGNOSIS — B20 Human immunodeficiency virus [HIV] disease: Secondary | ICD-10-CM

## 2022-09-18 MED ORDER — BIKTARVY 50-200-25 MG PO TABS: 1.0000 | ORAL_TABLET | Freq: Every day | ORAL | 3 refills | Status: DC

## 2023-10-03 ENCOUNTER — Other Ambulatory Visit: Payer: Self-pay | Admitting: Urology

## 2023-10-17 NOTE — Patient Instructions (Addendum)
 SURGICAL WAITING ROOM VISITATION  Patients having surgery or a procedure may have no more than 2 support people in the waiting area - these visitors may rotate.    Children under the age of 2 must have an adult with them who is not the patient.  Due to an increase in RSV and influenza rates and associated hospitalizations, children ages 26 and under may not visit patients in [facility] hospitals.  Visitors with respiratory illnesses are discouraged from visiting and should remain at home.  If the patient needs to stay at the hospital during part of their recovery, the visitor guidelines for inpatient rooms apply. Pre-op nurse will coordinate an appropriate time for 1 support person to accompany patient in pre-op.  This support person may not rotate.    Please refer to the [facility] website for the visitor guidelines for Inpatients (after your surgery is over and you are in a regular room).    Your procedure is scheduled on: 10/25/23   Report to [facility] Main Entrance    Report to admitting at 5:15 AM   Call this number if you have problems the morning of surgery [phone]   Follow a clear liquid diet the day before surgery.  Water Non-Citrus Juices (without pulp, NO RED-Apple, White grape, White cranberry) Black Coffee (NO MILK/CREAM OR CREAMERS, sugar ok)  Clear Tea (NO MILK/CREAM OR CREAMERS, sugar ok) regular and decaf                             Plain Jell-O (NO RED)                                           Fruit ices (not with fruit pulp, NO RED)                                     Popsicles (NO RED)                                                               Sports drinks like Gatorade (NO RED)              Nothing to drink after midnight.          If you have questions, please contact your surgeon's office.   FOLLOW BOWEL PREP AND ANY ADDITIONAL PRE OP INSTRUCTIONS YOU RECEIVED FROM YOUR SURGEON'S OFFICE!!!     Oral Hygiene is also important to  reduce your risk of infection.                                    Remember - BRUSH YOUR TEETH THE MORNING OF SURGERY WITH YOUR REGULAR TOOTHPASTE  DENTURES WILL BE REMOVED PRIOR TO SURGERY PLEASE DO NOT APPLY "Poly grip" OR ADHESIVES!!!   Stop all vitamins and herbal supplements 7 days before surgery.   Take these medicines the morning of surgery with A SIP OF WATER: Biktarvy, Tamsulosin  You may not have any metal on your body including jewelry, and body piercing             Do not wear lotions, powders, cologne, or deodorant              Men may shave face and neck.   Do not bring valuables to the hospital. [facility] IS NOT             RESPONSIBLE   FOR VALUABLES.   Contacts, glasses, dentures or bridgework may not be worn into surgery.   Bring small overnight bag day of surgery.   DO NOT BRING YOUR HOME MEDICATIONS TO THE HOSPITAL. PHARMACY WILL DISPENSE MEDICATIONS LISTED ON YOUR MEDICATION LIST TO YOU DURING YOUR ADMISSION IN THE HOSPITAL!              Please read over the following fact sheets you were given: IF YOU HAVE QUESTIONS ABOUT YOUR PRE-OP INSTRUCTIONS PLEASE CALL [phone] [place]ras    If you received a COVID test during your pre-op visit  it is requested that you wear a mask when out in public, stay away from anyone that may not be feeling well and notify your surgeon if you develop symptoms. If you test positive for Covid or have been in contact with anyone that has tested positive in the last 10 days please notify you surgeon.    [facility] - Preparing for Surgery Before surgery, you can play an important role.  Because skin is not sterile, your skin needs to be as free of germs as possible.  You can reduce the number of germs on your skin by washing with CHG (chlorahexidine gluconate) soap before surgery.  CHG is an antiseptic cleaner which kills germs and bonds with the skin to continue killing germs even after washing. Please DO  NOT use if you have an allergy to CHG or antibacterial soaps.  If your skin becomes reddened/irritated stop using the CHG and inform your nurse when you arrive at Short Stay. Do not shave (including legs and underarms) for at least 48 hours prior to the first CHG shower.  You may shave your face/neck.  Please follow these instructions carefully:  1.  Shower with CHG Soap the night before surgery and the  morning of surgery.  2.  If you choose to wash your hair, wash your hair first as usual with your normal  shampoo.  3.  After you shampoo, rinse your hair and body thoroughly to remove the shampoo.                             4.  Use CHG as you would any other liquid soap.  You can apply chg directly to the skin and wash.  Gently with a scrungie or clean washcloth.  5.  Apply the CHG Soap to your body ONLY FROM THE NECK DOWN.   Do   not use on face/ open                           Wound or open sores. Avoid contact with eyes, ears mouth and   genitals (private parts).                       Wash face,  Genitals (private parts) with your normal soap.  6.  Wash thoroughly, paying special attention to the area where your    surgery  will be performed.  7.  Thoroughly rinse your body with warm water from the neck down.  8.  DO NOT shower/wash with your normal soap after using and rinsing off the CHG Soap.                9.  Pat yourself dry with a clean towel.            10.  Wear clean pajamas.            11.  Place clean sheets on your bed the night of your first shower and do not  sleep with pets. Day of Surgery : Do not apply any lotions/deodorants the morning of surgery.  Please wear clean clothes to the hospital/surgery center.  FAILURE TO FOLLOW THESE INSTRUCTIONS MAY RESULT IN THE CANCELLATION OF YOUR SURGERY  PATIENT SIGNATURE_________________________________  NURSE  SIGNATURE__________________________________  ________________________________________________________________________

## 2023-10-18 ENCOUNTER — Encounter (HOSPITAL_COMMUNITY): Payer: Self-pay

## 2023-10-18 ENCOUNTER — Encounter (HOSPITAL_COMMUNITY)
Admission: RE | Admit: 2023-10-18 | Discharge: 2023-10-18 | Disposition: A | Discharge status: Home / Self Care | Payer: 59 | Source: Ambulatory Visit | Attending: Urology | Admitting: Urology

## 2023-10-18 ENCOUNTER — Other Ambulatory Visit: Payer: Self-pay

## 2023-10-18 VITALS — BP 136/87 | HR 57 | Temp 97.7°F | Resp 16 | Ht 74.0 in | Wt 206.0 lb

## 2023-10-18 DIAGNOSIS — Z01812 Encounter for preprocedural laboratory examination: Secondary | ICD-10-CM | POA: Diagnosis not present

## 2023-10-18 DIAGNOSIS — Z01818 Encounter for other preprocedural examination: Secondary | ICD-10-CM | POA: Diagnosis present

## 2023-10-18 HISTORY — DX: Asymptomatic human immunodeficiency virus (hiv) infection status: Z21

## 2023-10-18 HISTORY — DX: Headache, unspecified: R51.9

## 2023-10-18 LAB — CBC
HCT: 43.5 % (ref 39.0–52.0)
Hemoglobin: 14.8 g/dL (ref 13.0–17.0)
MCH: 30.6 pg (ref 26.0–34.0)
MCHC: 34 g/dL (ref 30.0–36.0)
MCV: 89.9 fL (ref 80.0–100.0)
Platelets: 199 10*3/uL (ref 150–400)
RBC: 4.84 MIL/uL (ref 4.22–5.81)
RDW: 13.4 % (ref 11.5–15.5)
WBC: 3.5 10*3/uL — ABNORMAL LOW (ref 4.0–10.5)
nRBC: 0 % (ref 0.0–0.2)

## 2023-10-18 NOTE — Progress Notes (Signed)
 COVID Vaccine Completed: yes  Date of COVID positive in last 90 days: no  PCP - Darlis Loan, DO Cardiologist - n/a  Chest x-ray - n/a EKG - n/a Stress Test - every year ECHO - n/a Cardiac Cath - n/a Pacemaker/ICD device last checked: n/a Spinal Cord Stimulator: n/a  Bowel Prep - clears day before, mag citrate, patient has instructions  Sleep Study - n/a CPAP -   Fasting Blood Sugar - n/a Checks Blood Sugar _____ times a day  Last dose of GLP1 agonist-  N/A GLP1 instructions:  Hold 7 days before surgery    Last dose of SGLT-2 inhibitors-  N/A SGLT-2 instructions:  Hold 3 days before surgery    Blood Thinner Instructions:  Last dose: n/a  Time: Aspirin Instructions: Last Dose:  Activity level: Can go up a flight of stairs and perform activities of daily living without stopping and without symptoms of chest pain or shortness of breath.   Anesthesia review:   Patient denies shortness of breath, fever, cough and chest pain at PAT appointment  Patient verbalized understanding of instructions that were given to them at the PAT appointment. Patient was also instructed that they will need to review over the PAT instructions again at home before surgery.

## 2023-10-20 IMAGING — MR MR PROSTATE WO/W CM
14 series · 48 of 48 positions shown · IV contrast (multihance)
Comparison: None.

CLINICAL DATA: Elevated PSA.

EXAM:
MR PROSTATE WITHOUT AND WITH CONTRAST
TECHNIQUE: Multiplanar multisequence MRI images were obtained of the pelvis
centered about the prostate. Pre and post contrast images were
obtained.
CONTRAST:  19mL MULTIHANCE GADOBENATE DIMEGLUMINE 529 MG/ML IV SOLN

[Series 3: T2 · coronal · 3.0mm · 0.56mm/px · 1 of 20 slices shown (1 of 5)]
[im 1/20]
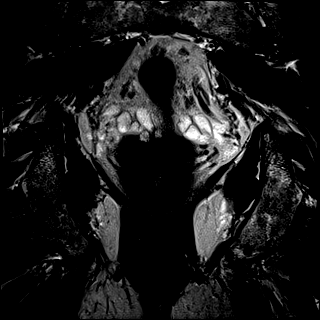

[Series 4: T1 · axial · 5.0mm · 1.25mm/px · 1 of 88 slices shown]
[im 1/88]
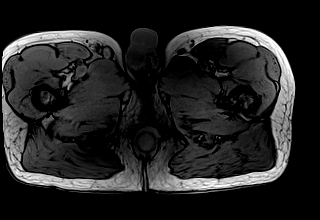

[Series 5: DWI · axial · 3.0mm · 1.75mm/px · 1 of 84 slices shown (1 of 3)]
[im 1/84]
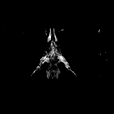

[Series 6: DWI · axial · 3.0mm · 1.75mm/px · 1 of 28 slices shown (2 of 3)]
[im 1/28]
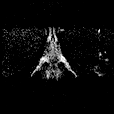

[Series 7: DWI · axial · 3.0mm · 1.75mm/px · 1 of 28 slices shown (3 of 3)]
[im 1/28]
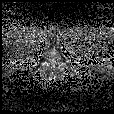

[Series 8: T2 · axial · 3.0mm · 0.56mm/px · 1 of 28 slices shown (2 of 5)]
[im 1/28]
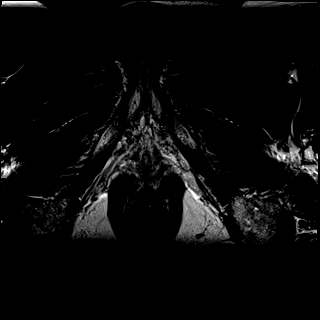

[Series 9: T2 · axial · 1.0mm · 1.04mm/px · z∈[-35,+44]mm · 2 of 80 slices shown (3 of 5)]
[im 1/80]
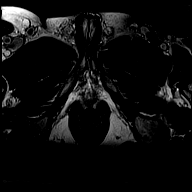
[im 80/80]
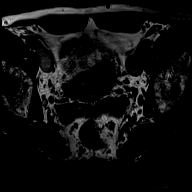

[Series 10: pre t1_twist_tra_dyn · axial · non-contrast · 3.5mm · 0.83mm/px · 1 of 22 slices shown]
[im 1/22]
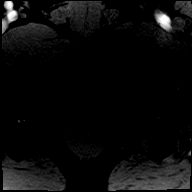

[Series 11: T2 · axial · 1.0mm · 1.04mm/px · z∈[-35,+44]mm · 2 of 80 slices shown (4 of 5)]
[im 1/80]
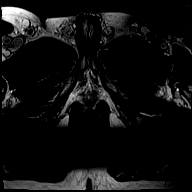
[im 80/80]
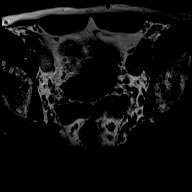

[Series 12: T2 · axial · 3.0mm · 0.28mm/px · 1 of 28 slices shown (5 of 5)]
[im 1/28]
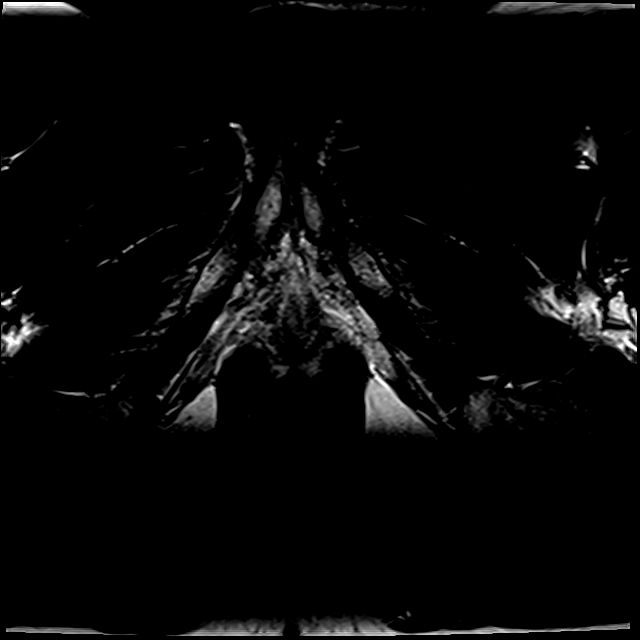

[Series 13: post t1_twist_tra_dyn-copy center · axial · non-contrast · 3.5mm · 0.83mm/px · z∈[-34,+40]mm · 16 of 660 slices shown]
[im 1/660]
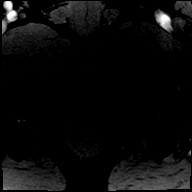
[im 44/660]
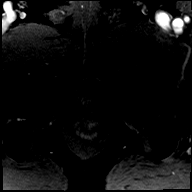
[im 88/660]
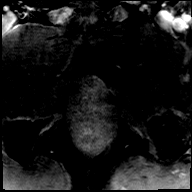
[im 132/660]
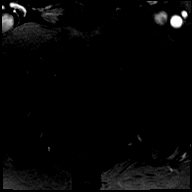
[im 176/660]
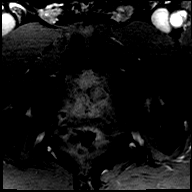
[im 220/660]
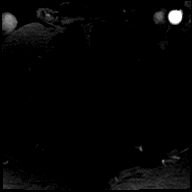
[im 264/660]
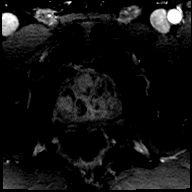
[im 308/660]
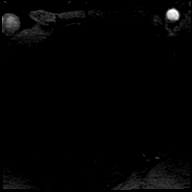
[im 352/660]
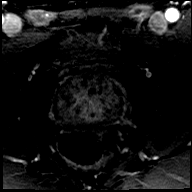
[im 396/660]
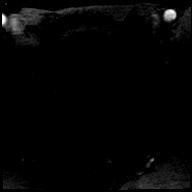
[im 440/660]
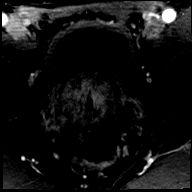
[im 484/660]
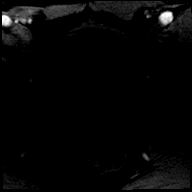
[im 528/660]
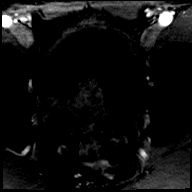
[im 572/660]
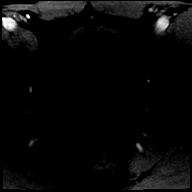
[im 616/660]
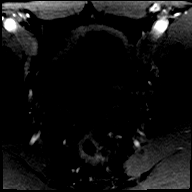
[im 660/660]
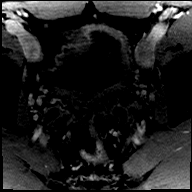

[Series 14: post t1_twist_tra_dyn-copy cent_sub · axial · 3.5mm · 0.83mm/px · z∈[-34,+40]mm · 16 of 638 slices shown]
[im 1/638]
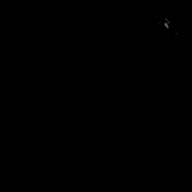
[im 43/638]
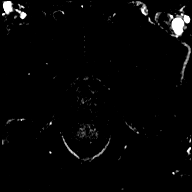
[im 85/638]
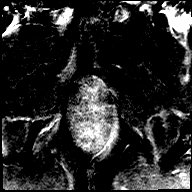
[im 128/638]
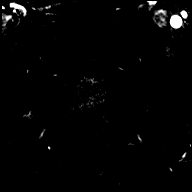
[im 170/638]
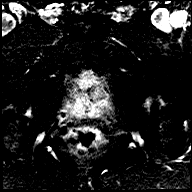
[im 213/638]
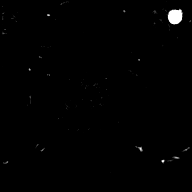
[im 255/638]
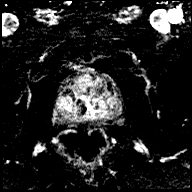
[im 298/638]
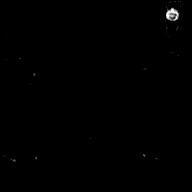
[im 340/638]
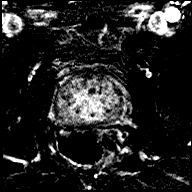
[im 383/638]
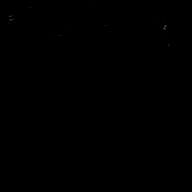
[im 425/638]
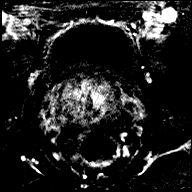
[im 468/638]
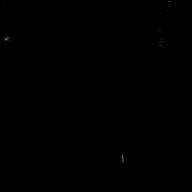
[im 510/638]
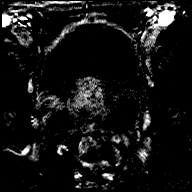
[im 553/638]
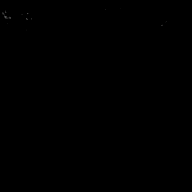
[im 595/638]
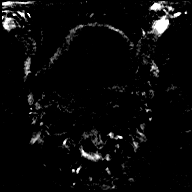
[im 638/638]
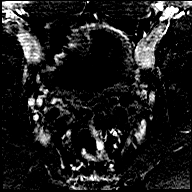

[Series 15: t1_vibe_dixon_tra_f · axial · 2.5mm · 0.91mm/px · z∈[-70,+148]mm · 2 of 88 slices shown]
[im 1/88]
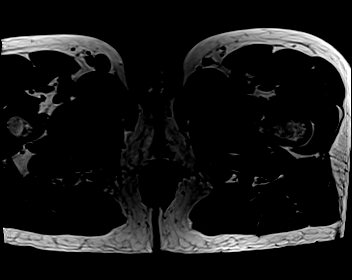
[im 88/88]
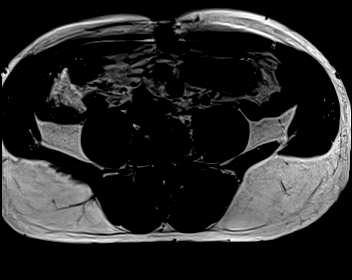

[Series 16: t1_vibe_dixon_tra_w · axial · 2.5mm · 0.91mm/px · z∈[-70,+148]mm · 2 of 88 slices shown]
[im 1/88]
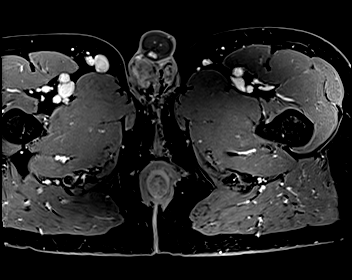
[im 88/88]
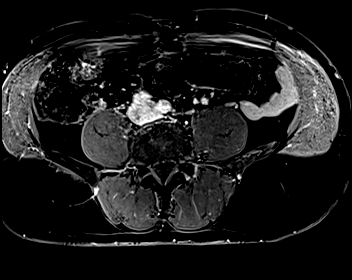

[48 of 48 positions shown; findings below may reference images not displayed]

FINDINGS: Prostate:

-- Peripheral Zone: Diffuse thinning due to central gland
enlargement. No focal lesion seen on ADC or high b-value DWI
sequences.

-- Transition/Central Zone: Moderately enlarged with involvement by
BPH nodules. An extruded BPH nodules incidentally noted in the right
base. No nodules with suspicious characteristics on T2-weighted or
diffusion imaging.

-- Measurements/Volume:  6.0 by 4.5 x 6.0 cm (volume = 85 cm^3)

Transcapsular spread:  Absent

Seminal vesicle involvement:  Absent

Neurovascular bundle involvement:  Absent

Pelvic adenopathy: None visualized

Bone metastasis: None visualized

Other:  None
IMPRESSION: No radiographic evidence of high-grade prostate carcinoma. PI-RADS 1
(v2.1): Very Low (clinically significant cancer highly unlikely)

## 2023-10-25 ENCOUNTER — Other Ambulatory Visit: Payer: Self-pay

## 2023-10-25 ENCOUNTER — Inpatient Hospital Stay (HOSPITAL_COMMUNITY): Admitting: Certified Registered"

## 2023-10-25 ENCOUNTER — Inpatient Hospital Stay (HOSPITAL_COMMUNITY)
Admission: RE | Admit: 2023-10-25 | Discharge: 2023-10-26 | DRG: 717 | Disposition: A | Discharge status: Home / Self Care | Payer: 59 | Attending: Urology | Admitting: Urology

## 2023-10-25 ENCOUNTER — Encounter (HOSPITAL_COMMUNITY): Payer: Self-pay | Admitting: Urology

## 2023-10-25 ENCOUNTER — Encounter (HOSPITAL_COMMUNITY)
Admission: RE | Disposition: A | Discharge status: Home / Self Care | Payer: Self-pay | Source: Home / Self Care | Attending: Urology

## 2023-10-25 DIAGNOSIS — Z8042 Family history of malignant neoplasm of prostate: Secondary | ICD-10-CM

## 2023-10-25 DIAGNOSIS — N401 Enlarged prostate with lower urinary tract symptoms: Secondary | ICD-10-CM

## 2023-10-25 DIAGNOSIS — Z79899 Other long term (current) drug therapy: Secondary | ICD-10-CM

## 2023-10-25 DIAGNOSIS — N138 Other obstructive and reflux uropathy: Secondary | ICD-10-CM

## 2023-10-25 DIAGNOSIS — R519 Headache, unspecified: Secondary | ICD-10-CM | POA: Diagnosis present

## 2023-10-25 DIAGNOSIS — K429 Umbilical hernia without obstruction or gangrene: Secondary | ICD-10-CM | POA: Diagnosis present

## 2023-10-25 DIAGNOSIS — B20 Human immunodeficiency virus [HIV] disease: Secondary | ICD-10-CM | POA: Diagnosis present

## 2023-10-25 DIAGNOSIS — Z23 Encounter for immunization: Secondary | ICD-10-CM

## 2023-10-25 DIAGNOSIS — R319 Hematuria, unspecified: Secondary | ICD-10-CM | POA: Diagnosis present

## 2023-10-25 DIAGNOSIS — R972 Elevated prostate specific antigen [PSA]: Secondary | ICD-10-CM | POA: Diagnosis present

## 2023-10-25 HISTORY — PX: XI ROBOTIC ASSISTED SIMPLE PROSTATECTOMY: SHX6713

## 2023-10-25 LAB — HEMOGLOBIN AND HEMATOCRIT, BLOOD
HCT: 44.6 % (ref 39.0–52.0)
Hemoglobin: 14.3 g/dL (ref 13.0–17.0)

## 2023-10-25 SURGERY — PROSTATECTOMY, SIMPLE, ROBOT-ASSISTED
Anesthesia: General | Site: Abdomen

## 2023-10-25 MED ORDER — INFLUENZA VIRUS VACC SPLIT PF (FLUZONE) 0.5 ML IM SUSY
0.5000 mL | PREFILLED_SYRINGE | INTRAMUSCULAR | Status: AC
  Administered 2023-10-26: 0.5 mL via INTRAMUSCULAR
  Filled 2023-10-25: qty 0.5

## 2023-10-25 MED ORDER — ACETAMINOPHEN 500 MG PO TABS
1000.0000 mg | ORAL_TABLET | Freq: Once | ORAL | Status: AC
  Administered 2023-10-25: 1000 mg via ORAL

## 2023-10-25 MED ORDER — LACTATED RINGERS IV SOLN: INTRAVENOUS | Status: DC | PRN

## 2023-10-25 MED ORDER — HYDROMORPHONE HCL 1 MG/ML IJ SOLN
0.5000 mg | INTRAMUSCULAR | Status: DC | PRN
Start: 2023-10-25 — End: 2023-10-27
  Administered 2023-10-25 – 2023-10-26 (×4): 1 mg via INTRAVENOUS
  Filled 2023-10-25 (×3): qty 1

## 2023-10-25 MED ORDER — SODIUM CHLORIDE (PF) 0.9 % IJ SOLN
INTRAMUSCULAR | Status: AC
  Filled 2023-10-25: qty 20

## 2023-10-25 MED ORDER — BUPIVACAINE LIPOSOME 1.3 % IJ SUSP
INTRAMUSCULAR | Status: AC
  Filled 2023-10-25: qty 20

## 2023-10-25 MED ORDER — ONDANSETRON HCL 4 MG/2ML IJ SOLN: 4.0000 mg | INTRAMUSCULAR | Status: DC | PRN

## 2023-10-25 MED ORDER — DIPHENHYDRAMINE HCL 12.5 MG/5ML PO ELIX: 12.5000 mg | ORAL_SOLUTION | Freq: Four times a day (QID) | ORAL | Status: DC | PRN

## 2023-10-25 MED ORDER — SODIUM CHLORIDE 0.9 % IR SOLN
3000.0000 mL | Status: DC
  Administered 2023-10-25 – 2023-10-26 (×4): 3000 mL

## 2023-10-25 MED ORDER — KETAMINE HCL 10 MG/ML IJ SOLN
INTRAMUSCULAR | Status: DC | PRN
  Administered 2023-10-25: 25 mg via INTRAVENOUS

## 2023-10-25 MED ORDER — KETAMINE HCL 50 MG/5ML IJ SOSY
PREFILLED_SYRINGE | INTRAMUSCULAR | Status: AC
  Filled 2023-10-25: qty 5

## 2023-10-25 MED ORDER — SUGAMMADEX SODIUM 200 MG/2ML IV SOLN
INTRAVENOUS | Status: DC | PRN
  Administered 2023-10-25: 200 mg via INTRAVENOUS

## 2023-10-25 MED ORDER — ONDANSETRON HCL 4 MG/2ML IJ SOLN
INTRAMUSCULAR | Status: DC | PRN
Start: 2023-10-25 — End: 2023-10-25
  Administered 2023-10-25: 4 mg via INTRAVENOUS

## 2023-10-25 MED ORDER — SUCCINYLCHOLINE CHLORIDE 200 MG/10ML IV SOSY
PREFILLED_SYRINGE | INTRAVENOUS | Status: AC
  Filled 2023-10-25: qty 10

## 2023-10-25 MED ORDER — CEFAZOLIN SODIUM-DEXTROSE 2-4 GM/100ML-% IV SOLN
2.0000 g | INTRAVENOUS | Status: AC
  Administered 2023-10-25: 2 g via INTRAVENOUS
  Filled 2023-10-25: qty 100

## 2023-10-25 MED ORDER — ROCURONIUM BROMIDE 10 MG/ML (PF) SYRINGE
PREFILLED_SYRINGE | INTRAVENOUS | Status: DC | PRN
Start: 2023-10-25 — End: 2023-10-25
  Administered 2023-10-25: 30 mg via INTRAVENOUS
  Administered 2023-10-25: 70 mg via INTRAVENOUS

## 2023-10-25 MED ORDER — BICTEGRAVIR-EMTRICITAB-TENOFOV 50-200-25 MG PO TABS
1.0000 | ORAL_TABLET | Freq: Every day | ORAL | Status: DC
  Administered 2023-10-26: 1 via ORAL
  Filled 2023-10-25: qty 1

## 2023-10-25 MED ORDER — FENTANYL CITRATE (PF) 100 MCG/2ML IJ SOLN
INTRAMUSCULAR | Status: DC | PRN
  Administered 2023-10-25: 100 ug via INTRAVENOUS
  Administered 2023-10-25: 50 ug via INTRAVENOUS

## 2023-10-25 MED ORDER — MIDAZOLAM HCL 5 MG/5ML IJ SOLN
INTRAMUSCULAR | Status: DC | PRN
  Administered 2023-10-25: 2 mg via INTRAVENOUS

## 2023-10-25 MED ORDER — ORAL CARE MOUTH RINSE: 15.0000 mL | OROMUCOSAL | Status: DC | PRN

## 2023-10-25 MED ORDER — ORAL CARE MOUTH RINSE: 15.0000 mL | Freq: Once | OROMUCOSAL | Status: AC

## 2023-10-25 MED ORDER — CHLORHEXIDINE GLUCONATE 0.12 % MT SOLN
15.0000 mL | Freq: Once | OROMUCOSAL | Status: AC
  Administered 2023-10-25: 15 mL via OROMUCOSAL

## 2023-10-25 MED ORDER — DEXAMETHASONE SODIUM PHOSPHATE 10 MG/ML IJ SOLN
INTRAMUSCULAR | Status: AC
  Filled 2023-10-25: qty 1

## 2023-10-25 MED ORDER — ACETAMINOPHEN 500 MG PO TABS
1000.0000 mg | ORAL_TABLET | Freq: Four times a day (QID) | ORAL | Status: AC
  Administered 2023-10-25 – 2023-10-26 (×4): 1000 mg via ORAL
  Filled 2023-10-25 (×5): qty 2

## 2023-10-25 MED ORDER — FENTANYL CITRATE PF 50 MCG/ML IJ SOSY
PREFILLED_SYRINGE | INTRAMUSCULAR | Status: AC
  Filled 2023-10-25: qty 2

## 2023-10-25 MED ORDER — FENTANYL CITRATE PF 50 MCG/ML IJ SOSY
25.0000 ug | PREFILLED_SYRINGE | INTRAMUSCULAR | Status: DC | PRN
  Administered 2023-10-25 (×3): 50 ug via INTRAVENOUS

## 2023-10-25 MED ORDER — SODIUM CHLORIDE 0.9 % IV SOLN: INTRAVENOUS | Status: AC

## 2023-10-25 MED ORDER — FENTANYL CITRATE PF 50 MCG/ML IJ SOSY
PREFILLED_SYRINGE | INTRAMUSCULAR | Status: AC
  Filled 2023-10-25: qty 1

## 2023-10-25 MED ORDER — TRIPLE ANTIBIOTIC 3.5-400-5000 EX OINT: 1.0000 | TOPICAL_OINTMENT | Freq: Three times a day (TID) | CUTANEOUS | Status: DC | PRN

## 2023-10-25 MED ORDER — MAGNESIUM CITRATE PO SOLN
1.0000 | Freq: Once | ORAL | Status: DC
  Filled 2023-10-25: qty 296

## 2023-10-25 MED ORDER — DOCUSATE SODIUM 100 MG PO CAPS
100.0000 mg | ORAL_CAPSULE | Freq: Two times a day (BID) | ORAL | Status: DC
  Administered 2023-10-25 – 2023-10-26 (×3): 100 mg via ORAL
  Filled 2023-10-25 (×3): qty 1

## 2023-10-25 MED ORDER — FENTANYL CITRATE (PF) 250 MCG/5ML IJ SOLN
INTRAMUSCULAR | Status: AC
  Filled 2023-10-25: qty 5

## 2023-10-25 MED ORDER — PROPOFOL 10 MG/ML IV BOLUS
INTRAVENOUS | Status: AC
  Filled 2023-10-25: qty 20

## 2023-10-25 MED ORDER — LABETALOL HCL 5 MG/ML IV SOLN
INTRAVENOUS | Status: DC | PRN
  Administered 2023-10-25: 5 mg via INTRAVENOUS

## 2023-10-25 MED ORDER — OXYCODONE HCL 5 MG/5ML PO SOLN: 5.0000 mg | Freq: Once | ORAL | Status: DC | PRN

## 2023-10-25 MED ORDER — PHENYLEPHRINE HCL (PRESSORS) 10 MG/ML IV SOLN
INTRAVENOUS | Status: AC
  Filled 2023-10-25: qty 1

## 2023-10-25 MED ORDER — DOCUSATE SODIUM 100 MG PO CAPS: 100.0000 mg | ORAL_CAPSULE | Freq: Two times a day (BID) | ORAL | Status: DC

## 2023-10-25 MED ORDER — ROCURONIUM BROMIDE 10 MG/ML (PF) SYRINGE
PREFILLED_SYRINGE | INTRAVENOUS | Status: AC
  Filled 2023-10-25: qty 10

## 2023-10-25 MED ORDER — DEXAMETHASONE SODIUM PHOSPHATE 10 MG/ML IJ SOLN
INTRAMUSCULAR | Status: DC | PRN
  Administered 2023-10-25: 10 mg via INTRAVENOUS

## 2023-10-25 MED ORDER — SODIUM CHLORIDE 0.9 % IV BOLUS
1000.0000 mL | Freq: Once | INTRAVENOUS | Status: AC
  Administered 2023-10-25: 1000 mL via INTRAVENOUS

## 2023-10-25 MED ORDER — PROPOFOL 10 MG/ML IV BOLUS
INTRAVENOUS | Status: DC | PRN
  Administered 2023-10-25: 200 mg via INTRAVENOUS

## 2023-10-25 MED ORDER — STERILE WATER FOR IRRIGATION IR SOLN
Status: DC | PRN
  Administered 2023-10-25: 1000 mL

## 2023-10-25 MED ORDER — HYDROCODONE-ACETAMINOPHEN 5-325 MG PO TABS: 1.0000 | ORAL_TABLET | Freq: Four times a day (QID) | ORAL | 0 refills | Status: DC | PRN

## 2023-10-25 MED ORDER — SULFAMETHOXAZOLE-TRIMETHOPRIM 800-160 MG PO TABS: 1.0000 | ORAL_TABLET | Freq: Two times a day (BID) | ORAL | 0 refills | Status: DC

## 2023-10-25 MED ORDER — SODIUM CHLORIDE (PF) 0.9 % IJ SOLN
INTRAMUSCULAR | Status: DC | PRN
  Administered 2023-10-25: 40 mL

## 2023-10-25 MED ORDER — ONDANSETRON HCL 4 MG/2ML IJ SOLN
INTRAMUSCULAR | Status: AC
  Filled 2023-10-25: qty 2

## 2023-10-25 MED ORDER — LACTATED RINGERS IV SOLN: INTRAVENOUS | Status: DC

## 2023-10-25 MED ORDER — DIPHENHYDRAMINE HCL 50 MG/ML IJ SOLN: 12.5000 mg | Freq: Four times a day (QID) | INTRAMUSCULAR | Status: DC | PRN

## 2023-10-25 MED ORDER — PNEUMOCOCCAL 20-VAL CONJ VACC 0.5 ML IM SUSY
0.5000 mL | PREFILLED_SYRINGE | INTRAMUSCULAR | Status: AC
Start: 2023-10-26 — End: 2023-10-26
  Administered 2023-10-26: 0.5 mL via INTRAMUSCULAR
  Filled 2023-10-25: qty 0.5

## 2023-10-25 MED ORDER — GLYCOPYRROLATE 0.2 MG/ML IJ SOLN
INTRAMUSCULAR | Status: DC | PRN
  Administered 2023-10-25: .2 mg via INTRAVENOUS

## 2023-10-25 MED ORDER — OXYCODONE HCL 5 MG PO TABS: 5.0000 mg | ORAL_TABLET | ORAL | Status: DC | PRN

## 2023-10-25 MED ORDER — HYDROMORPHONE HCL 1 MG/ML IJ SOLN
INTRAMUSCULAR | Status: AC
  Filled 2023-10-25: qty 1

## 2023-10-25 MED ORDER — CHLORHEXIDINE GLUCONATE CLOTH 2 % EX PADS
6.0000 | MEDICATED_PAD | Freq: Every day | CUTANEOUS | Status: DC
  Administered 2023-10-25 – 2023-10-26 (×2): 6 via TOPICAL

## 2023-10-25 MED ORDER — HYOSCYAMINE SULFATE 0.125 MG SL SUBL: 0.1250 mg | SUBLINGUAL_TABLET | SUBLINGUAL | Status: DC | PRN

## 2023-10-25 MED ORDER — OXYCODONE HCL 5 MG PO TABS: 5.0000 mg | ORAL_TABLET | Freq: Once | ORAL | Status: DC | PRN

## 2023-10-25 MED ORDER — LIDOCAINE HCL (PF) 2 % IJ SOLN
INTRAMUSCULAR | Status: DC | PRN
  Administered 2023-10-25: 60 mg via INTRADERMAL

## 2023-10-25 MED ORDER — MIDAZOLAM HCL 2 MG/2ML IJ SOLN
INTRAMUSCULAR | Status: AC
  Filled 2023-10-25: qty 2

## 2023-10-25 MED ORDER — ACETAMINOPHEN 500 MG PO TABS
ORAL_TABLET | ORAL | Status: AC
Start: 2023-10-25 — End: 2023-10-25
  Filled 2023-10-25: qty 2

## 2023-10-25 SURGICAL SUPPLY — 64 items
APPLICATOR COTTON TIP 6 STRL (MISCELLANEOUS) ×1 IMPLANT
APPLICATOR COTTON TIP 6IN STRL (MISCELLANEOUS) ×1 IMPLANT
BAG COUNTER SPONGE SURGICOUNT (BAG) IMPLANT
CATH FOLEY 2WAY SLVR 18FR 30CC (CATHETERS) ×1 IMPLANT
CATH TIEMANN FOLEY 18FR 5CC (CATHETERS) IMPLANT
CATH URTH STD 24FR FL 3W 2 (CATHETERS) ×1 IMPLANT
CHLORAPREP W/TINT 26 (MISCELLANEOUS) ×1 IMPLANT
CLIP LIGATING HEM O LOK PURPLE (MISCELLANEOUS) IMPLANT
CLOTH BEACON ORANGE TIMEOUT ST (SAFETY) ×1 IMPLANT
COVER SURGICAL LIGHT HANDLE (MISCELLANEOUS) ×1 IMPLANT
COVER TIP SHEARS 8 DVNC (MISCELLANEOUS) ×1 IMPLANT
CUTTER ECHEON FLEX ENDO 45 340 (ENDOMECHANICALS) IMPLANT
DERMABOND ADVANCED .7 DNX12 (GAUZE/BANDAGES/DRESSINGS) ×1 IMPLANT
DRAIN CHANNEL RND F F (WOUND CARE) IMPLANT
DRAPE ARM DVNC X/XI (DISPOSABLE) ×4 IMPLANT
DRAPE COLUMN DVNC XI (DISPOSABLE) ×1 IMPLANT
DRAPE SURG IRRIG POUCH 19X23 (DRAPES) ×1 IMPLANT
DRIVER NDL LRG 8 DVNC XI (INSTRUMENTS) ×2 IMPLANT
DRIVER NDLE LRG 8 DVNC XI (INSTRUMENTS) ×2 IMPLANT
DRSG TEGADERM 4X4.75 (GAUZE/BANDAGES/DRESSINGS) ×1 IMPLANT
ELECT PENCIL ROCKER SW 15FT (MISCELLANEOUS) ×1 IMPLANT
ELECT REM PT RETURN 15FT ADLT (MISCELLANEOUS) ×1 IMPLANT
FORCEPS BPLR LNG DVNC XI (INSTRUMENTS) ×1 IMPLANT
FORCEPS PROGRASP DVNC XI (FORCEP) ×1 IMPLANT
GAUZE SPONGE 4X4 12PLY STRL (GAUZE/BANDAGES/DRESSINGS) ×1 IMPLANT
GLOVE BIO SURGEON STRL SZ 6.5 (GLOVE) ×1 IMPLANT
GLOVE BIOGEL PI IND STRL 7.5 (GLOVE) IMPLANT
GLOVE SURG LX STRL 7.5 STRW (GLOVE) ×2 IMPLANT
GOWN SRG XL LVL 4 BRTHBL STRL (GOWNS) ×1 IMPLANT
GOWN STRL REUS W/ TWL XL LVL3 (GOWN DISPOSABLE) ×2 IMPLANT
HOLDER FOLEY CATH W/STRAP (MISCELLANEOUS) ×1 IMPLANT
IRRIG SUCT STRYKERFLOW 2 WTIP (MISCELLANEOUS) ×1 IMPLANT
IRRIGATION SUCT STRKRFLW 2 WTP (MISCELLANEOUS) ×1 IMPLANT
IV LACTATED RINGERS 1000ML (IV SOLUTION) ×1 IMPLANT
KIT TURNOVER KIT A (KITS) IMPLANT
NDL INSUFFLATION 14GA 120MM (NEEDLE) ×1 IMPLANT
NEEDLE INSUFFLATION 14GA 120MM (NEEDLE) ×1 IMPLANT
PACK ROBOT UROLOGY CUSTOM (CUSTOM PROCEDURE TRAY) ×1 IMPLANT
PAD POSITIONING PINK XL (MISCELLANEOUS) ×1 IMPLANT
PORT ACCESS TROCAR AIRSEAL 12 (TROCAR) ×1 IMPLANT
RELOAD STAPLE 45 4.1 GRN THCK (STAPLE) IMPLANT
SCISSORS MNPLR CVD DVNC XI (INSTRUMENTS) ×1 IMPLANT
SEAL UNIV 5-12 XI (MISCELLANEOUS) ×4 IMPLANT
SET CYSTO W/LG BORE CLAMP LF (SET/KITS/TRAYS/PACK) IMPLANT
SET TRI-LUMEN FLTR TB AIRSEAL (TUBING) ×1 IMPLANT
SOL ELECTROSURG ANTI STICK (MISCELLANEOUS) ×1 IMPLANT
SOLUTION ELECTROSURG ANTI STCK (MISCELLANEOUS) ×1 IMPLANT
SPIKE FLUID TRANSFER (MISCELLANEOUS) ×1 IMPLANT
SPONGE T-LAP 4X18 ~~LOC~~+RFID (SPONGE) IMPLANT
STAPLE RELOAD 45 GRN (STAPLE) ×1 IMPLANT
SUT ETHILON 3 0 PS 1 (SUTURE) ×1 IMPLANT
SUT MNCRL AB 4-0 PS2 18 (SUTURE) ×2 IMPLANT
SUT PDS AB 1 CT1 27 (SUTURE) ×2 IMPLANT
SUT STRATA PDS 2-0 23 CT-1 (SUTURE) ×2 IMPLANT
SUT STRATAFIX SPIRAL 3-0 PDS+ (SUTURE) ×2 IMPLANT
SUT VIC AB 0 CT1 27XBRD ANTBC (SUTURE) ×3 IMPLANT
SUT VIC AB 2-0 SH 27X BRD (SUTURE) ×1 IMPLANT
SUT VICRYL 0 UR6 27IN ABS (SUTURE) ×1 IMPLANT
SUTURE STRATFX SPIRAL 3-0 PDS+ (SUTURE) ×2 IMPLANT
SYS BAG RETRIEVAL 10MM (BASKET) ×1 IMPLANT
SYSTEM BAG RETRIEVAL 10MM (BASKET) ×1 IMPLANT
TROCAR Z-THREAD FIOS 5X100MM (TROCAR) IMPLANT
TROCAR Z-THREAD OPTICAL 5X100M (TROCAR) IMPLANT
WATER STERILE IRR 1000ML POUR (IV SOLUTION) ×1 IMPLANT

## 2023-10-25 NOTE — Anesthesia Preprocedure Evaluation (Signed)
 Anesthesia Evaluation  Patient identified by MRN, date of birth, ID band Patient awake    Reviewed: Allergy & Precautions, NPO status , Patient's Chart, lab work & pertinent test results  Airway Mallampati: II  TM Distance: >3 FB Neck ROM: Full    Dental no notable dental hx.    Pulmonary neg pulmonary ROS   Pulmonary exam normal        Cardiovascular negative cardio ROS  Rhythm:Regular Rate:Normal     Neuro/Psych  Headaches   Depression       GI/Hepatic Neg liver ROS,GERD  ,,  Endo/Other  negative endocrine ROS    Renal/GU negative Renal ROS   Prostate hypertrophy    Musculoskeletal negative musculoskeletal ROS (+)    Abdominal Normal abdominal exam  (+)   Peds  Hematology Lab Results      Component                Value               Date                      WBC                      3.5 (L)             10/18/2023                HGB                      14.8                10/18/2023                HCT                      43.5                10/18/2023                MCV                      89.9                10/18/2023                PLT                      199                 10/18/2023              Anesthesia Other Findings   Reproductive/Obstetrics                             Anesthesia Physical Anesthesia Plan  ASA: 2  Anesthesia Plan: General   Post-op Pain Management: Tylenol PO (pre-op)*   Induction: Intravenous  PONV Risk Score and Plan: 2 and Ondansetron, Dexamethasone, Midazolam and Treatment may vary due to age or medical condition  Airway Management Planned: Mask and Oral ETT  Additional Equipment: None  Intra-op Plan:   Post-operative Plan: Extubation in OR  Informed Consent: I have reviewed the patients History and Physical, chart, labs and discussed the procedure including the risks, benefits and alternatives for the proposed anesthesia with the  patient or authorized representative  who has indicated his/her understanding and acceptance.     Dental advisory given  Plan Discussed with: CRNA  Anesthesia Plan Comments:        Anesthesia Quick Evaluation

## 2023-10-25 NOTE — H&P (Signed)
 Billy [name] is an 63 y.o. male.    Chief Complaint: Pre-OP Simple Prostatecotmy  HPI:   1 - Elevated PSA - Father with prostate cancer.  2023 - low risk MRI ==> negative TRUS BX for PSA 12, 101gm   2 - Lower Urinary Tract Symptoms - long h/o mostly obstructive symptoms on tamsulosin + finastierde. Prostate 101gm by TRUS 2023, no median.   PMH sig for small umbilical hernia. NO CV disease / blood thinners. 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 His PCP is Darlis Loan DO with [facility] in [place].   Today " Billy [name] " (pronounced Ab-dool-ay) is seen to proceed with simple prostatectomy for his med-refracotry BPH. NO interval fevers. Hgb 14.8. Most recent UCX negative.    Past Medical History:  Diagnosis Date   Headache    few times a month   HIV (human immunodeficiency virus infection) (HCC)     Past Surgical History:  Procedure Laterality Date   EYE SURGERY Right    cosmetic eye    No family history on file. Social History:  reports that he has never smoked. He has never used smokeless tobacco. He reports that he does not drink alcohol and does not use drugs.  Allergies: No Known Allergies  Medications Prior to Admission  Medication Sig Dispense Refill   bictegravir-emtricitabine-tenofovir AF (BIKTARVY) 50-200-25 MG TABS tablet Take 1 tablet by mouth daily. 90 tablet 3   tamsulosin (FLOMAX) 0.4 MG CAPS capsule Take 1 tablet by mouth daily.      No results found for this or any previous visit (from the past 48 hours). No results found.  Review of Systems  Constitutional:  Negative for chills and fever.  Genitourinary:  Positive for difficulty urinating.  All other systems reviewed and are negative.   Blood pressure (!) (P) 153/91, pulse (P) 64, temperature (P) 98 F (36.7 C), temperature source (P) Oral, resp. rate (P) 16, SpO2 (P) 100%. Physical Exam Vitals reviewed.  HENT:     Head: Normocephalic.     Nose: Nose normal.  Eyes:     Pupils: Pupils  are equal, round, and reactive to light.  Cardiovascular:     Rate and Rhythm: Normal rate.  Pulmonary:     Effort: Pulmonary effort is normal.  Abdominal:     General: Abdomen is flat.  Genitourinary:    Comments: No CVAT at present Musculoskeletal:        General: Normal range of motion.     Cervical back: Normal range of motion.  Skin:    General: Skin is warm.  Neurological:     General: No focal deficit present.     Mental Status: He is alert.      Assessment/Plan  Proceed as planned with simple prostatectomy. Risks, benefits, alternatives, expected peri-op course discussed previously and reiterated today.   Loletta Parish., MD 10/25/2023, 6:41 AM

## 2023-10-25 NOTE — Brief Op Note (Signed)
 10/25/2023  9:41 AM  PATIENT:  Billy [name]  63 y.o. male  PRE-OPERATIVE DIAGNOSIS:  MASSIVE PROSTATE  POST-OPERATIVE DIAGNOSIS:  MASSIVE PROSTATE, Umbilical Hernia  PROCEDURE:  Procedure(s) with comments: PROSTATECTOMY, SIMPLE, ROBOT-ASSISTED (N/A) - 180 MINUTES NEEDED FOR CASE, Open Umbilical Hernia Repair  SURGEON:  Surgeons and Role:    * Manny, Delbert Phenix., MD - Primary  PHYSICIAN ASSISTANT:   ASSISTANTS: Harrie Foreman PA   ANESTHESIA:   local and general  EBL:  400 mL   BLOOD ADMINISTERED:none  DRAINS:  1 JP to bulb; 2 - FOley to graivty (irrigation port plugged)    LOCAL MEDICATIONS USED:  MARCAINE     SPECIMEN:  Source of Specimen:  prostate adenoma  DISPOSITION OF SPECIMEN:  PATHOLOGY  COUNTS:  YES  TOURNIQUET:  * No tourniquets in log *  DICTATION: .Other Dictation: Dictation Number [id number]  [name] OF CARE: Admit for overnight observation  PATIENT DISPOSITION:  PACU - hemodynamically stable.   Delay start of Pharmacological VTE agent (>24hrs) due to surgical blood loss or risk of bleeding: yes

## 2023-10-25 NOTE — Transfer of Care (Signed)
 Immediate Anesthesia Transfer of Care Note  Patient: Billy [name]  Procedure(s) Performed: PROSTATECTOMY, SIMPLE, ROBOT-ASSISTED (Abdomen)  Patient Location: PACU  Anesthesia Type:General  Level of Consciousness: awake, alert , oriented, and patient cooperative  Airway & Oxygen Therapy: Patient Spontanous Breathing and Patient connected to face mask oxygen  Post-op Assessment: Report given to RN and Post -op Vital signs reviewed and stable  Post vital signs: Reviewed and stable  Last Vitals:  Vitals Value Taken Time  BP 161/101 10/25/23 0948  Temp    Pulse 57 10/25/23 0951  Resp 13 10/25/23 0951  SpO2 100 % 10/25/23 0951  Vitals shown include unfiled device data.  Last Pain:  Vitals:   10/25/23 0644  TempSrc:   PainSc: 0-No pain         Complications: No notable events documented.

## 2023-10-25 NOTE — Anesthesia Postprocedure Evaluation (Signed)
 Anesthesia Post Note  Patient: Billy [name]  Procedure(s) Performed: PROSTATECTOMY, SIMPLE, ROBOT-ASSISTED (Abdomen)     Patient location during evaluation: PACU Anesthesia Type: General Level of consciousness: awake and alert Pain management: pain level controlled Vital Signs Assessment: post-procedure vital signs reviewed and stable Respiratory status: spontaneous breathing, nonlabored ventilation, respiratory function stable and patient connected to nasal cannula oxygen Cardiovascular status: blood pressure returned to baseline and stable Postop Assessment: no apparent nausea or vomiting Anesthetic complications: no   No notable events documented.  Last Vitals:  Vitals:   10/25/23 1200 10/25/23 1316  BP: (!) 146/89 (!) 158/98  Pulse: 61 64  Resp: 15 16  Temp:  36.5 C  SpO2: 100% 100%    Last Pain:  Vitals:   10/25/23 1507  TempSrc:   PainSc: Asleep                 Nelle Don Gracelynn Bircher

## 2023-10-25 NOTE — Anesthesia Procedure Notes (Signed)
 Procedure Name: Intubation Date/Time: 10/25/2023 7:32 AM  Performed by: Ponciano Ort, CRNAPre-anesthesia Checklist: Patient identified, Emergency Drugs available, Suction available and Patient being monitored Patient Re-evaluated:Patient Re-evaluated prior to induction Oxygen Delivery Method: Circle system utilized Preoxygenation: Pre-oxygenation with 100% oxygen Induction Type: IV induction Ventilation: Mask ventilation without difficulty Laryngoscope Size: Mac and 4 Grade View: Grade I Tube type: Oral Tube size: 7.5 mm Number of attempts: 1 Airway Equipment and Method: Stylet and Oral airway Placement Confirmation: ETT inserted through vocal cords under direct vision, positive ETCO2 and breath sounds checked- equal and bilateral Secured at: 23 cm Tube secured with: Tape Dental Injury: Teeth and Oropharynx as per pre-operative assessment

## 2023-10-25 NOTE — Discharge Instructions (Signed)
 1- Drain Sites - You may have some mild persistent drainage from old drain site for several days, this is normal. This can be covered with cotton gauze for convenience.  2 - Stiches - Your stitches are all dissolvable. You may notice a "loose thread" at your incisions, these are normal and require no intervention. You may cut them flush to the skin with fingernail clippers if needed for comfort.  3 - Diet - No restrictions  4 - Activity - No heavy lifting / straining (any activities that require valsalva or "bearing down") x 4 weeks. Otherwise, no restrictions.  5 - Bathing - You may shower immediately. Do not take a bath or get into swimming pool where incision sites are submersed in water x 4 weeks.   6 - Catheter - Will remain in place until removed at your next appointment. It may be cleaned with soap and water in the shower. It may be disconnected from the drain bag while in the shower to avoid tripping over the tube. You may apply Neosporin or Vaseline ointment as needed to the tip of the penis where the catheter inserts to reduce friction and irritation in this spot.   7 - When to Call the Doctor - Call MD for any fever >102, any acute wound problems, or any severe nausea / vomiting. You can call the [facility] [phone]) 24 hours a day 365 days a year. It will roll-over to the answering service and on-call physician after hours.

## 2023-10-25 NOTE — Op Note (Signed)
 NAMEQUANTA, [name] MEDICAL RECORD NO: [id number] ACCOUNT NO: [id number] DATE OF BIRTH: Aug 24, 1960 FACILITY: [facility] LOCATION: [facility] PHYSICIAN: Sebastian Ache, MD  Operative Report   DATE OF PROCEDURE: 10/25/2023  PREOPERATIVE DIAGNOSES: 1.  Large prostate with refractory lower urinary tract symptoms. 2.  Umbilical hernia.  PROCEDURES PERFORMED: 1.  Robotic assisted laparoscopic simple prostatectomy.  2.  Open umbilical hernia repair.  ESTIMATED BLOOD LOSS:  400 mL.  COMPLICATIONS:  None.  SPECIMENS: Prostate adenoma for pathology.  FINDINGS: 1.  Bilobar, significant prostatic hypertrophy. 2.  Wide open urinary channel from the bladder neck to the membranous urethra following simple prostatectomy. 3.  Approximately 1.5 cm defect umbilical hernia without strangulation. 4.  Resolution of hernia defect following closure.  ASSISTANTS:  Harrie Foreman, PA  DRAINS: 1.  Jackson-Pratt drain to bulb suction. 2.  Foley catheter to straight drain.  DESCRIPTION OF PROCEDURE: The patient is a very pleasant 63 year old man who was found on workup of progressive obstructive voiding and elevated PSA to have massive prostatic hypertrophy of the gland greater than 100 g.  Despite maximal medical therapy with 5-alpha reductase  inhibitors and alpha-blockers, his urinary tract symptoms have remained refractory.  He also has a history of a significant elevated PSA and biopsy of this unfortunately ruled out any significant carcinoma.  We discussed further management including  continued medical therapy versus other procedures of various types and given his large gland at approximately 100 g and greater, and he wished to proceed wtih simple prostatectomy. Informed consent was obtained and placed in medical records.   PROCEDURE IN DETAIL:  The patient being Billy [name] verified and the procedure being robotic simple prostatectomy was confirmed.  Procedure timeout was performed.   Intravenous antibiotics were administered.  General endotracheal anesthesia  induced.  The patient was placed into a low lithotomy position.  Sterile field was created, prepped and draped the patient's penis, perineum, and proximal thighs using iodine and his infra-xiphoid abdomen using chlorhexidine gluconate after clipper  shaving.  He was further fastened to operating table using 3-inch tape with foam padding across supraxiphoid chest, arm tucked to the side with gel rolls.  He did have a small umbilical hernia, palpable defect approximately 1.5 cm.  This was easily  reduced manually.  Next a high flow low pressure pneumoperitoneum was obtained using Veress technique in the supraumbilical midline having passed the aspiration and drop test.  An 8 mm robotic camera port was placed in the same location.  This was  approximately 3 cm superior to the umbilicus.  Laparoscopic examination of peritoneal cavity revealed no adhesions, no visceral injury.  Additional ports were placed as follows:  Right paramedian 8 mm robotic port, right far lateral 12 mm AirSeal assist  port, right paramedian 5 mm suction port, left paramedian 8 mm robotic port, left far lateral 8 mm robotic port.  Robot was docked, having passed the electronic checks.  Attention was then directed to the development of the space of Retzius.  Incision made lateral to the right median umbilical ligament from the midline towards the area of internal ring, coursing along the iliac vessels and the right vas deferens was  encountered and it was not ligated and the right bladder wall swept away the pelvic sidewall towards the area of the endopelvic fascia on the right side.  A mirror image dissection was performed on the left side.  Anterior attachment was taken down with  cautery scissors.  The anterior base of the prostate was defatted  to better denote the true bladder neck and the prostate junction and this fat was then set aside for discard.  Next,  the dorsal venous complex was then carefully visualized and expose and  was controlled using green load stapler, taking exquisite care to avoid membranous urethral injury, which did not occur.  The true bladder neck was then identified, moving the catheter back and forth and then an inverted U cystotomy was made  approximately 2 cm proximal to the true bladder neck for estimated 40% of the circumference of the bladder.  This allowed excellent visualization of the large prostate adenoma.  This was probably bilobar hypertrophy.  CT 1 Vicryl stay sutures were then  applied to the adenoma in a figure of eight fashion, one in the right lobe, one in the left lobe, and one posteriorly to manipulate the adenoma. The posterior suture was placed on anterior traction and a posterior mucosal incision was made approximately  1.5 cm distal to the trigonal ridge having visualized the orifices very well.  Posterior adenoma plane was entered at the 6 o'clock position and this was carried inferiorly all the way to the area of the apex as noted by anterior curvature.  The  posterior plane was somewhat desmoplastic likely from his prior prostate biopsy, but no obvious signs of any active infection. Posterior plane was then carried anteriorly first on the left side towards the area of the 9 o'clock position and then from the  9 o'clock to the 12 o'clock position. Finally, a similar mobilization of the adenoma was performed on the right side from the 6 o'clock position to the 3 o'clock position and then 3 o'clock to 12 o'clock.  The entire adenoma was placed on superior bulb  retraction and a butterfly incision was made on the anterior commissure at the 12 o'clock position, which allowed better visualization of the true apex, which was transected, taking exquisite care to avoid membranous urethral injury.  This was completely  freed up with a large adenoma specimen and it was placed into the Endocatch bag for later  retrieval.  Next, a digital rectal exam was performed using the indicator glove.  No evidence of rectal dilation was noted.  The adenoma fossa was carefully infiltrated using point coagulation current, which resulted in excellent hemostasis.  Attention was directed  at the mucosal bridge.  A double-arm 3-0 V-Loc suture was used to reapproximate the posterior bladder mucosa to the posterior membranous urethra.  This was performed for approximately 50% of the circumference of the urethra.  This resulted in excellent  mucosal bridge across the posterior prostate fossa and excellent hemostasis.  The trigone and ureteral orifices were again visualized and remained visibly patent.  The inverted U cystotomy was then closed using two separate running suture layers of 2-0  V-Loc connecting in the midline and tied together.  A new 24-French 3-way Foley catheter was then placed, 30 mL sterile saline balloon was irrigated quantitatively.  Sponge, needle and instrument counts were correct.  Hemostasis was excellent.  Closed  suction drain was brought to the previous left lateral most robotic port site into the peritoneal cavity.  The robot was then undocked.  The specimen was retrieved by extending the previous incision site inferiorly,  slightly to the right side of the  umbilicus, purposely entering the umbilical hernia defect and the adenoma specimen was removed and set aside for permanent pathology.  The true fascial edges were all then carefully identified and the fascia was closed  using figure-of-eight Novafil  incorporating the umbilical hernia defect within the closure, which held in excellent fascial apposition.  All incisions were infiltrated with dilute lipolyzed Marcaine.  The extraction site was further closed at the level of the Scarpa's using running  Vicryl and then all incision sites were closed at the level of the skin using subcuticular Monocryl and Dermabond.  The procedure was terminated.  The  patient tolerated the procedure well.  There were no immediate periprocedural complications.  The  patient will be taken to the post anesthesia care unit in stable condition.  Plan for observation admission.  Please note first assistant, Harrie Foreman was crucial for all portions of the surgery today.  She provided invaluable retraction, suctioning, vascular stapling, specimen manipulation, robotic instrument exchange, and general first assistance.   MUK D: 10/25/2023 9:50:15 am T: 10/25/2023 10:50:00 am  JOB: [id number]/ [id number]

## 2023-10-25 NOTE — Progress Notes (Signed)
 S: POD 0 s/p simple prostatectomy.  No complaints  O: NAD, Resting Non-labored breathign on RA RRR SNTND JP serosanguinous non-foul Catheter with thick bloody urine off irrigation. SCD's in place  Hgb 14 post-op  A/P: Catheter irrigated to clear and placed on gentle CBI, NSG notified. 60mL total infoley balloon on strap traction.

## 2023-10-26 ENCOUNTER — Encounter (HOSPITAL_COMMUNITY): Payer: Self-pay | Admitting: Urology

## 2023-10-26 LAB — BASIC METABOLIC PANEL
Anion gap: 6 (ref 5–15)
BUN: 12 mg/dL (ref 6–20)
CO2: 24 mmol/L (ref 22–32)
Calcium: 9.2 mg/dL (ref 8.9–10.3)
Chloride: 108 mmol/L (ref 98–111)
Creatinine, Ser: 1.32 mg/dL — ABNORMAL HIGH (ref 0.61–1.24)
GFR, Estimated: >60 mL/min (ref >60)
Glucose, Bld: 98 mg/dL (ref 70–99)
Potassium: 4.1 mmol/L (ref 3.5–5.1)
Sodium: 138 mmol/L (ref 135–145)

## 2023-10-26 LAB — HEMOGLOBIN AND HEMATOCRIT, BLOOD
HCT: 39 % (ref 39.0–52.0)
Hemoglobin: 12.8 g/dL — ABNORMAL LOW (ref 13.0–17.0)

## 2023-10-26 NOTE — Plan of Care (Signed)
  Problem: Education: Goal: Knowledge of the procedure and recovery process will improve Outcome: Progressing   Problem: Pain Management: Goal: General experience of comfort will improve Outcome: Progressing   Problem: Urinary Elimination: Goal: Ability to avoid or minimize complications of infection will improve Outcome: Progressing   Problem: Education: Goal: Knowledge of General Education information will improve Description: Including pain rating scale, medication(s)/side effects and non-pharmacologic comfort measures Outcome: Progressing   Problem: Coping: Goal: Level of anxiety will decrease Outcome: Progressing

## 2023-10-26 NOTE — Progress Notes (Signed)
 Mobility Specialist - Progress Note   10/26/23 1500  Mobility  Activity Ambulated with assistance in hallway  Level of Assistance Standby assist, set-up cues, supervision of patient - no hands on  Assistive Device Front wheel walker  Distance Ambulated (ft) 350 ft  Range of Motion/Exercises Active  Activity Response Tolerated well  Mobility Referral Yes  Mobility visit 1 Mobility  Mobility Specialist Start Time (ACUTE ONLY) 1445  Mobility Specialist Stop Time (ACUTE ONLY) 1500  Mobility Specialist Time Calculation (min) (ACUTE ONLY) 15 min   Pt was found in bed and agreeable to ambulate. No complaints with session. At EOS returned to sit EOB with all needs met. Call bell in reach and family in room.  Billey Chang Mobility Specialist

## 2023-10-26 NOTE — Progress Notes (Signed)
   10/26/23 1128  TOC Brief Assessment  Insurance and Status Reviewed  Patient has primary care physician Yes  Home environment has been reviewed Resides in single family home with spouse  Prior level of function: Independent with ADLs at baseline  Prior/Current Home Services No current home services  Social Drivers of Health Review SDOH reviewed no interventions necessary  Readmission risk has been reviewed Yes  Transition of care needs no transition of care needs at this time

## 2023-10-26 NOTE — Progress Notes (Addendum)
 Catheter irrigated to clear/pink multiple blood clots removed. Patient tolerated well. CBI placed on slow to moderate.

## 2023-10-26 NOTE — Progress Notes (Signed)
 Urology Inpatient Progress Report  BPH with obstruction/lower urinary tract symptoms [N40.1, N13.8]  Procedure(s): PROSTATECTOMY, SIMPLE, ROBOT-ASSISTED  1 Day Post-Op   Intv/Subj: Foley catheter was occluded, the patient developed intense pain.  Pain subsequently improved significantly once the Foley catheter started draining.  There are no clots in the catheter today and the CBI is draining very slowly, the urine is clearing nicely. Patient is without complaint.  Principal Problem:   BPH with obstruction/lower urinary tract symptoms  Current Facility-Administered Medications  Medication Dose Route Frequency Provider Last Rate Last Admin   0.9 %  sodium chloride infusion   Intravenous Continuous Harrie Foreman, PA-C 75 mL/hr at 10/26/23 0735 New Bag at 10/26/23 0735   bictegravir-emtricitabine-tenofovir AF (BIKTARVY) 50-200-25 MG per tablet 1 tablet  1 tablet Oral Daily Harrie Foreman, PA-C   1 tablet at 10/26/23 0901   Chlorhexidine Gluconate Cloth 2 % PADS 6 each  6 each Topical Daily Loletta Parish., MD   6 each at 10/25/23 1551   diphenhydrAMINE (BENADRYL) injection 12.5-25 mg  12.5-25 mg Intravenous Q6H PRN Harrie Foreman, PA-C       Or   diphenhydrAMINE (BENADRYL) 12.5 MG/5ML elixir 12.5-25 mg  12.5-25 mg Oral Q6H PRN Harrie Foreman, PA-C       docusate sodium (COLACE) capsule 100 mg  100 mg Oral BID Dancy, Amanda, PA-C   100 mg at 10/26/23 0901   HYDROmorphone (DILAUDID) injection 0.5-1 mg  0.5-1 mg Intravenous Q2H PRN Harrie Foreman, PA-C   1 mg at 10/25/23 2059   hyoscyamine (LEVSIN SL) SL tablet 0.125 mg  0.125 mg Sublingual Q4H PRN Harrie Foreman, PA-C       influenza vac split trivalent PF (FLULAVAL) injection 0.5 mL  0.5 mL Intramuscular Tomorrow-1000 Berneice Heinrich Delbert Phenix., MD       neomycin-bacitracin-polymyxin [phone] OINT 1 Application  1 Application Topical TID PRN Harrie Foreman, PA-C       ondansetron (ZOFRAN) injection 4 mg  4 mg Intravenous Q4H PRN Harrie Foreman, PA-C       Oral care mouth rinse  15 mL Mouth Rinse PRN Berneice Heinrich Delbert Phenix., MD       oxyCODONE (Oxy IR/ROXICODONE) immediate release tablet 5 mg  5 mg Oral Q4H PRN Harrie Foreman, PA-C       pneumococcal 20-valent conjugate vaccine (PREVNAR 20) injection 0.5 mL  0.5 mL Intramuscular Tomorrow-1000 Loletta Parish., MD         Objective: Vital: Vitals:   10/25/23 2241 10/26/23 0211 10/26/23 0535 10/26/23 0932  BP: 131/81 119/66 125/77 122/81  Pulse: (!) 59 (!) 57 (!) 50 (!) 56  Resp: 20 16 16 13   Temp: 99.1 F (37.3 C) 99 F (37.2 C) 99.4 F (37.4 C) 98.4 F (36.9 C)  TempSrc: Oral Oral Oral   SpO2: 100% 100% 100% 100%  Weight:      Height:       I/Os: I/O last 3 completed shifts: In: 14549.9 [P.O.:360; I.V.:2189.9; Other:12000] Out: 40981 [Urine:18350; Drains:175; Blood:350]  Physical Exam:  General: Patient is in no apparent distress Lungs: Normal respiratory effort, chest expands symmetrically. GI: Incisions are c/d/i. The abdomen is soft and nontender without mass. JP drain with serosanguinous drainage Foley: Straw-colored on slow CBI Ext: lower extremities symmetric  Lab Results: Recent Labs    10/25/23 1008 10/26/23 0403  HGB 14.3 12.8*  HCT 44.6 39.0   Recent Labs    10/26/23 0403  NA 138  K 4.1  CL 108  CO2 24  GLUCOSE 98  BUN 12  CREATININE 1.32*  CALCIUM 9.2   No results for input(s): "LABPT", "INR" in the last 72 hours. No results for input(s): "LABURIN" in the last 72 hours. Results for orders placed or performed during the hospital encounter of 04/06/19  Novel Coronavirus, NAA (hospital order; send-out to ref lab)     Status: None   Collection Time: 04/06/19 11:37 AM   Specimen: Nasopharyngeal Swab; Respiratory  Result Value Ref Range Status   SARS-CoV-2, NAA NOT DETECTED NOT DETECTED Final    Comment: (NOTE) This test was developed and its performance characteristics determined by [employer]. This test has not  been FDA cleared or approved. This test has been authorized by FDA under an Emergency Use Authorization (EUA). This test is only authorized for the duration of time the declaration that circumstances exist justifying the authorization of the emergency use of in vitro diagnostic tests for detection of SARS-CoV-2 virus and/or diagnosis of COVID-19 infection under section 564(b)(1) of the Act, 21 U.S.C. 536UYQ-0(H)(4), unless the authorization is terminated or revoked sooner. When diagnostic testing is negative, the possibility of a false negative result should be considered in the context of a patient's recent exposures and the presence of clinical signs and symptoms consistent with COVID-19. An individual without symptoms of COVID-19 and who is not shedding SARS-CoV-2 virus would expect to have a negative (not detected) result in this assay. Performed  At: [facility] [address] [place], [place] [id number] Jolene Schimke MD VF:[id number]    Coronavirus Source NASOPHARYNGEAL  Final    Comment: Performed at [facility], [address] [place], [place] [address]    Studies/Results: No results found.  Assessment: Procedure(s): PROSTATECTOMY, SIMPLE, ROBOT-ASSISTED, 1 Day Post-Op  doing well.  Plan: Discontinue the drain Discontinue CBI Advance diet as tolerated Ambulate Evaluate for discharge in the afternoon.  If he is not ready for discharge, we will reevaluate him tomorrow morning   Berniece Salines, MD Urology 10/26/2023, 10:56 AM

## 2023-10-26 NOTE — Discharge Summary (Signed)
 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 Briefly, Billy [name] is a 63 y.o. year old patient with enlarge prostate.   Hospital Course: Patient tolerated the procedure well.  He was then transferred to the floor after an uneventful PACU stay.  His hospital course was uncomplicated.  On POD#1 he had met discharge criteria: was eating a regular diet, was up and ambulating independently,  pain was well controlled, and was ready to for discharge.   Laboratory values:  Recent Labs    10/25/23 1008 10/26/23 0403  HGB 14.3 12.8*  HCT 44.6 39.0   Recent Labs    10/26/23 0403  NA 138  K 4.1  CL 108  CO2 24  GLUCOSE 98  BUN 12  CREATININE 1.32*  CALCIUM 9.2   No results for input(s): "LABPT", "INR" in the last 72 hours. No results for input(s): "LABURIN" in the last 72 hours. Results for orders placed or performed during the hospital encounter of 04/06/19  Novel Coronavirus, NAA (hospital order; send-out to ref lab)      Status: None   Collection Time: 04/06/19 11:37 AM   Specimen: Nasopharyngeal Swab; Respiratory  Result Value Ref Range Status   SARS-CoV-2, NAA NOT DETECTED NOT DETECTED Final    Comment: (NOTE) This test was developed and its performance characteristics determined by [employer]. This test has not been FDA cleared or approved. This test has been authorized by FDA under an Emergency Use Authorization (EUA). This test is only authorized for the duration of time the declaration that circumstances exist justifying the authorization of the emergency use of in vitro diagnostic tests for detection of SARS-CoV-2 virus and/or diagnosis of COVID-19 infection under section 564(b)(1) of the Act, 21 U.S.C. 161WRU-0(A)(5), unless the authorization is terminated or revoked sooner. When diagnostic testing is negative, the possibility of a false negative result should be considered in the context of a patient's recent exposures and the presence of clinical signs and symptoms consistent with COVID-19. An individual without symptoms of COVID-19 and who is not shedding SARS-CoV-2 virus would expect to have a negative (not detected) result in this assay. Performed  At: [facility] [address] [place], [place] [id number] Jolene Schimke MD NW:[id number]    Coronavirus Source NASOPHARYNGEAL  Final    Comment: Performed at [facility], [address] [place], [place] [address]    Disposition: Home  Discharge instruction: The patient was instructed to be ambulatory but told to refrain from heavy lifting, strenuous activity, or driving.   Discharge medications:  Allergies as of 10/26/2023   No Known Allergies      Medication List  STOP taking these medications    tamsulosin 0.4 MG Caps capsule Commonly known as: FLOMAX       TAKE these medications    Biktarvy 50-200-25 MG Tabs tablet Generic drug: bictegravir-emtricitabine-tenofovir AF Take 1 tablet by mouth  daily.   docusate sodium 100 MG capsule Commonly known as: COLACE Take 1 capsule (100 mg total) by mouth 2 (two) times daily.   HYDROcodone-acetaminophen 5-325 MG tablet Commonly known as: NORCO/VICODIN Take 1-2 tablets by mouth every 6 (six) hours as needed for moderate pain (pain score 4-6) or severe pain (pain score 7-10).   sulfamethoxazole-trimethoprim 800-160 MG tablet Commonly known as: BACTRIM DS Take 1 tablet by mouth 2 (two) times daily. Start the day prior to foley removal appointment        Followup:   Follow-up Information     Berneice Heinrich Delbert Phenix., MD Follow up on 11/04/2023.   Specialty: Urology Why: at 9 AM for MD visit, pathology review, and catheter removal. Contact information: [address] [place] [address] [phone]

## 2023-10-28 LAB — SURGICAL PATHOLOGY

## 2023-11-08 ENCOUNTER — Telehealth: Payer: Self-pay

## 2023-11-08 DIAGNOSIS — B20 Human immunodeficiency virus [HIV] disease: Secondary | ICD-10-CM

## 2023-11-08 MED ORDER — BIKTARVY 50-200-25 MG PO TABS: 1.0000 | ORAL_TABLET | Freq: Every day | ORAL | 0 refills | Status: DC

## 2023-11-08 NOTE — Telephone Encounter (Signed)
 Patient called requesting refill of Biktarvy. Discussed that he has not been seen since 03/2022 and would need to schedule with a new provider since Dr. Orvan Falconer retired. Scheduled with Dr. Thedore Mins for Monday.   He states he ran out of medication yesterday. A one year supply was sent in 09/2022. Advised that one refill would be sent but that he would need to keep his appointment on Monday for additional refills. Patient verbalized understanding and has no further questions.   Sandie Ano, RN

## 2023-11-11 ENCOUNTER — Ambulatory Visit: Admitting: Internal Medicine

## 2023-11-11 ENCOUNTER — Other Ambulatory Visit (HOSPITAL_COMMUNITY): Payer: Self-pay

## 2023-11-11 ENCOUNTER — Other Ambulatory Visit: Payer: Self-pay

## 2023-11-11 ENCOUNTER — Telehealth: Payer: Self-pay

## 2023-11-11 ENCOUNTER — Encounter: Payer: Self-pay | Admitting: Internal Medicine

## 2023-11-11 VITALS — BP 133/84 | HR 78 | Resp 16 | Ht 74.0 in | Wt 202.0 lb

## 2023-11-11 DIAGNOSIS — L989 Disorder of the skin and subcutaneous tissue, unspecified: Secondary | ICD-10-CM

## 2023-11-11 DIAGNOSIS — Z1211 Encounter for screening for malignant neoplasm of colon: Secondary | ICD-10-CM

## 2023-11-11 DIAGNOSIS — B20 Human immunodeficiency virus [HIV] disease: Secondary | ICD-10-CM | POA: Diagnosis not present

## 2023-11-11 MED ORDER — BIKTARVY 50-200-25 MG PO TABS: 1.0000 | ORAL_TABLET | Freq: Every day | ORAL | 5 refills | Status: DC

## 2023-11-11 MED ORDER — DOXYCYCLINE HYCLATE 100 MG PO TABS: 100.0000 mg | ORAL_TABLET | Freq: Two times a day (BID) | ORAL | 0 refills | Status: DC

## 2023-11-11 NOTE — Telephone Encounter (Signed)
 [facility] Patient Advocate Encounter   Was successful in obtaining a [employer] copay card for [facility].  This copay card will make the patients copay $0.  I have spoken with the patient.    The billing information is as follows and has been shared with [facility].  RxBin: [id number] PCN: [id number] Member ID: [id number] Group ID: [id number]    Kae Heller, CPhT Specialty Pharmacy Patient [facility] Phone: [phone] Fax:  [phone]

## 2023-11-11 NOTE — Progress Notes (Unsigned)
 [facility]     HPI: Billy [name] is a 63 y.o. male presents for HIV management on biktarvy. Last seen with Dr. Bonnita Nasuti in 2023. PT did not follow->going through prostate surgery due to elevated PsA. Pt states he only missed two missed doses.   History of Present Illness            Date of diagnosis: 20 years ago ART exposure: Combivir, Viramun -gevoyra->biktarvy Past Ois: none Risk factors:  hetrosexual Partners in last 2months 1, in the last 12 months 1.  Norton Pastel sex receptive***, insertive***. Contraception**** Oral sex, contraception*** Vaginal penile sex, contraception no  Social: Occupation: Lexicographer Housing: house Support: wife Understanding of HIV: Etoh/drug/tobacco use: socail/no/no  Past Medical History:  Diagnosis Date   Headache    few times a month   HIV (human immunodeficiency virus infection) (HCC)     Past Surgical History:  Procedure Laterality Date   EYE SURGERY Right    cosmetic eye   XI ROBOTIC ASSISTED SIMPLE PROSTATECTOMY N/A 10/25/2023   Procedure: PROSTATECTOMY, SIMPLE, ROBOT-ASSISTED;  Surgeon: Loletta Parish., MD;  Location: [facility];  Service: Urology;  Laterality: N/A;  180 MINUTES NEEDED FOR CASE    No family history on file. Current Outpatient Medications on File Prior to Visit  Medication Sig Dispense Refill   bictegravir-emtricitabine-tenofovir AF (BIKTARVY) 50-200-25 MG TABS tablet Take 1 tablet by mouth daily. 30 tablet 0   docusate sodium (COLACE) 100 MG capsule Take 1 capsule (100 mg total) by mouth 2 (two) times daily.     HYDROcodone-acetaminophen (NORCO/VICODIN) 5-325 MG tablet Take 1-2 tablets by mouth every 6 (six) hours as needed for moderate pain (pain score 4-6) or severe pain (pain score 7-10). 20 tablet 0   sulfamethoxazole-trimethoprim (BACTRIM DS) 800-160 MG tablet Take 1 tablet by mouth 2 (two) times daily. Start the day prior to foley removal appointment 6  tablet 0   No current facility-administered medications on file prior to visit.    No Known Allergies    Lab Results HIV 1 RNA Quant  Date Value  03/22/2022 Not Detected Copies/mL  02/17/2021 Not Detected Copies/mL  01/18/2020 <20 NOT DETECTED copies/mL   CD4 T Cell Abs (/uL)  Date Value  03/22/2022 564  01/18/2020 618  12/23/2018 443   No results found for: "HIV1GENOSEQ" Lab Results  Component Value Date   WBC 3.5 (L) 10/18/2023   HGB 12.8 (L) 10/26/2023   HCT 39.0 10/26/2023   MCV 89.9 10/18/2023   PLT 199 10/18/2023    Lab Results  Component Value Date   CREATININE 1.32 (H) 10/26/2023   BUN 12 10/26/2023   NA 138 10/26/2023   K 4.1 10/26/2023   CL 108 10/26/2023   CO2 24 10/26/2023   Lab Results  Component Value Date   ALT 16 03/22/2022   AST 20 03/22/2022   ALKPHOS 63 05/23/2015   BILITOT 0.5 03/22/2022    Lab Results  Component Value Date   CHOL 182 03/22/2022   TRIG 210 (H) 03/22/2022   HDL 33 (L) 03/22/2022   LDLCALC 116 (H) 03/22/2022   No results found for: "HAV" Lab Results  Component Value Date   HEPBSAG No 10/07/2006   HEPBSAB Yes 10/07/2006   Lab Results  Component Value Date   HCVAB No 10/07/2006   Lab Results  Component Value Date   CHLAMYDIAWP Negative 01/18/2020   N Negative 01/18/2020   No results  found for: "GCPROBEAPT" No results found for: "QUANTGOLD"  Assessment/Plan #HIV Results          -CD4***, VL***, on ***  Assessment and Plan          F/U 6 months  Leg lesion Right leg for about 2 weeks. It is painful 6/10. Possible cellulitis.  -doxy x 10 days     #Vaccination COVID-no Flu 2025 Monkeypox PCV-20 10/26/23 Meningitis HepA-immune HEpB-ommune Tdap->he thinks he reived after prostate surgery Shingles  #Health maintenance -Quantiferon -RPR -HCV -GC -Lipid -Mammogram  -Colonoscopy->refer    Danelle Earthly, MD [facility]

## 2023-11-12 ENCOUNTER — Other Ambulatory Visit: Payer: Self-pay

## 2023-11-12 DIAGNOSIS — B20 Human immunodeficiency virus [HIV] disease: Secondary | ICD-10-CM

## 2023-11-12 LAB — C. TRACHOMATIS/N. GONORRHOEAE RNA
C. trachomatis RNA, TMA: NOT DETECTED
N. gonorrhoeae RNA, TMA: NOT DETECTED

## 2023-11-12 MED ORDER — BIKTARVY 50-200-25 MG PO TABS: 1.0000 | ORAL_TABLET | Freq: Every day | ORAL | 5 refills | Status: DC

## 2023-11-13 ENCOUNTER — Other Ambulatory Visit: Payer: Self-pay

## 2023-11-13 MED ORDER — DOXYCYCLINE HYCLATE 100 MG PO TABS: 100.0000 mg | ORAL_TABLET | Freq: Two times a day (BID) | ORAL | 0 refills | Status: DC

## 2023-11-14 LAB — RPR: RPR Ser Ql: NONREACTIVE

## 2023-11-14 LAB — COMPLETE METABOLIC PANEL WITHOUT GFR
AG Ratio: 1.5 (calc) (ref 1.0–2.5)
ALT: 9 U/L (ref 9–46)
AST: 12 U/L (ref 10–35)
Albumin: 4.3 g/dL (ref 3.6–5.1)
Alkaline phosphatase (APISO): 70 U/L (ref 35–144)
BUN: 17 mg/dL (ref 7–25)
CO2: 25 mmol/L (ref 20–32)
Calcium: 10.4 mg/dL — ABNORMAL HIGH (ref 8.6–10.3)
Chloride: 106 mmol/L (ref 98–110)
Creat: 1.24 mg/dL (ref 0.70–1.35)
Globulin: 2.8 g/dL (ref 1.9–3.7)
Glucose, Bld: 95 mg/dL (ref 65–99)
Potassium: 4.3 mmol/L (ref 3.5–5.3)
Sodium: 140 mmol/L (ref 135–146)
Total Bilirubin: 0.3 mg/dL (ref 0.2–1.2)
Total Protein: 7.1 g/dL (ref 6.1–8.1)

## 2023-11-14 LAB — CBC WITH DIFFERENTIAL/PLATELET
Absolute Lymphocytes: 1398 {cells}/uL (ref 850–3900)
Absolute Monocytes: 372 {cells}/uL (ref 200–950)
Basophils Absolute: 53 {cells}/uL (ref 0–200)
Basophils Relative: 0.9 %
Eosinophils Absolute: 425 {cells}/uL (ref 15–500)
Eosinophils Relative: 7.2 %
HCT: 41 % (ref 38.5–50.0)
Hemoglobin: 13.4 g/dL (ref 13.2–17.1)
MCH: 30.3 pg (ref 27.0–33.0)
MCHC: 32.7 g/dL (ref 32.0–36.0)
MCV: 92.8 fL (ref 80.0–100.0)
MPV: 9.6 fL (ref 7.5–12.5)
Monocytes Relative: 6.3 %
Neutro Abs: 3652 {cells}/uL (ref 1500–7800)
Neutrophils Relative %: 61.9 %
Platelets: 334 10*3/uL (ref 140–400)
RBC: 4.42 10*6/uL (ref 4.20–5.80)
RDW: 13.1 % (ref 11.0–15.0)
Total Lymphocyte: 23.7 %
WBC: 5.9 10*3/uL (ref 3.8–10.8)

## 2023-11-14 LAB — LIPID PANEL
Cholesterol: 168 mg/dL (ref <200)
HDL: 37 mg/dL — ABNORMAL LOW (ref >=40)
LDL Cholesterol (Calc): 105 mg/dL — ABNORMAL HIGH
Non-HDL Cholesterol (Calc): 131 mg/dL — ABNORMAL HIGH (ref <130)
Total CHOL/HDL Ratio: 4.5 (calc) (ref <5.0)
Triglycerides: 151 mg/dL — ABNORMAL HIGH (ref <150)

## 2023-11-14 LAB — HEPATITIS C ANTIBODY: Hepatitis C Ab: NONREACTIVE

## 2023-11-14 LAB — QUANTIFERON-TB GOLD PLUS
Mitogen-NIL: 8.24 [IU]/mL
NIL: 0.03 [IU]/mL
QuantiFERON-TB Gold Plus: NEGATIVE
TB1-NIL: <0 [IU]/mL
TB2-NIL: <0 [IU]/mL

## 2023-11-14 LAB — T-HELPER CELLS (CD4) COUNT (NOT AT ARMC)
Absolute CD4: 469 {cells}/uL — ABNORMAL LOW (ref 490–1740)
CD4 T Helper %: 41 % (ref 30–61)
Total lymphocyte count: 1136 {cells}/uL (ref 850–3900)

## 2023-11-14 LAB — HIV-1 RNA ULTRAQUANT REFLEX TO GENTYP+
HIV 1 RNA Quant: NOT DETECTED {copies}/mL
HIV-1 RNA Quant, Log: NOT DETECTED {Log_copies}/mL

## 2023-11-19 ENCOUNTER — Emergency Department (HOSPITAL_BASED_OUTPATIENT_CLINIC_OR_DEPARTMENT_OTHER)

## 2023-11-19 ENCOUNTER — Emergency Department (HOSPITAL_COMMUNITY)

## 2023-11-19 ENCOUNTER — Other Ambulatory Visit: Payer: Self-pay

## 2023-11-19 ENCOUNTER — Encounter (HOSPITAL_COMMUNITY): Payer: Self-pay

## 2023-11-19 ENCOUNTER — Inpatient Hospital Stay (HOSPITAL_COMMUNITY)
Admission: EM | Admit: 2023-11-19 | Discharge: 2023-11-22 | DRG: 300 | Disposition: A | Discharge status: Home / Self Care | Attending: Internal Medicine | Admitting: Internal Medicine

## 2023-11-19 DIAGNOSIS — N433 Hydrocele, unspecified: Secondary | ICD-10-CM | POA: Diagnosis present

## 2023-11-19 DIAGNOSIS — K219 Gastro-esophageal reflux disease without esophagitis: Secondary | ICD-10-CM | POA: Diagnosis present

## 2023-11-19 DIAGNOSIS — I2699 Other pulmonary embolism without acute cor pulmonale: Secondary | ICD-10-CM | POA: Diagnosis present

## 2023-11-19 DIAGNOSIS — N1831 Chronic kidney disease, stage 3a: Secondary | ICD-10-CM | POA: Diagnosis present

## 2023-11-19 DIAGNOSIS — I700 Atherosclerosis of aorta: Secondary | ICD-10-CM | POA: Diagnosis present

## 2023-11-19 DIAGNOSIS — I82811 Embolism and thrombosis of superficial veins of right lower extremities: Secondary | ICD-10-CM | POA: Diagnosis present

## 2023-11-19 DIAGNOSIS — B9620 Unspecified Escherichia coli [E. coli] as the cause of diseases classified elsewhere: Secondary | ICD-10-CM | POA: Diagnosis present

## 2023-11-19 DIAGNOSIS — N452 Orchitis: Secondary | ICD-10-CM | POA: Diagnosis not present

## 2023-11-19 DIAGNOSIS — M79661 Pain in right lower leg: Secondary | ICD-10-CM | POA: Diagnosis not present

## 2023-11-19 DIAGNOSIS — R931 Abnormal findings on diagnostic imaging of heart and coronary circulation: Secondary | ICD-10-CM

## 2023-11-19 DIAGNOSIS — I2602 Saddle embolus of pulmonary artery with acute cor pulmonale: Secondary | ICD-10-CM | POA: Diagnosis not present

## 2023-11-19 DIAGNOSIS — N453 Epididymo-orchitis: Secondary | ICD-10-CM | POA: Diagnosis present

## 2023-11-19 DIAGNOSIS — F32A Depression, unspecified: Secondary | ICD-10-CM | POA: Diagnosis present

## 2023-11-19 DIAGNOSIS — Z1624 Resistance to multiple antibiotics: Secondary | ICD-10-CM | POA: Diagnosis present

## 2023-11-19 DIAGNOSIS — Z9889 Other specified postprocedural states: Secondary | ICD-10-CM

## 2023-11-19 DIAGNOSIS — N3090 Cystitis, unspecified without hematuria: Secondary | ICD-10-CM | POA: Diagnosis not present

## 2023-11-19 DIAGNOSIS — B20 Human immunodeficiency virus [HIV] disease: Secondary | ICD-10-CM | POA: Diagnosis present

## 2023-11-19 DIAGNOSIS — I8391 Asymptomatic varicose veins of right lower extremity: Secondary | ICD-10-CM | POA: Diagnosis present

## 2023-11-19 DIAGNOSIS — K5909 Other constipation: Secondary | ICD-10-CM | POA: Diagnosis present

## 2023-11-19 DIAGNOSIS — Z79624 Long term (current) use of inhibitors of nucleotide synthesis: Secondary | ICD-10-CM

## 2023-11-19 DIAGNOSIS — Z8589 Personal history of malignant neoplasm of other organs and systems: Secondary | ICD-10-CM

## 2023-11-19 DIAGNOSIS — N492 Inflammatory disorders of scrotum: Secondary | ICD-10-CM | POA: Diagnosis present

## 2023-11-19 DIAGNOSIS — N390 Urinary tract infection, site not specified: Secondary | ICD-10-CM | POA: Diagnosis not present

## 2023-11-19 DIAGNOSIS — Z9079 Acquired absence of other genital organ(s): Secondary | ICD-10-CM

## 2023-11-19 DIAGNOSIS — N179 Acute kidney failure, unspecified: Secondary | ICD-10-CM | POA: Diagnosis present

## 2023-11-19 DIAGNOSIS — Z7901 Long term (current) use of anticoagulants: Secondary | ICD-10-CM

## 2023-11-19 DIAGNOSIS — I2694 Multiple subsegmental pulmonary emboli without acute cor pulmonale: Secondary | ICD-10-CM | POA: Diagnosis present

## 2023-11-19 DIAGNOSIS — N3091 Cystitis, unspecified with hematuria: Secondary | ICD-10-CM | POA: Diagnosis present

## 2023-11-19 DIAGNOSIS — N401 Enlarged prostate with lower urinary tract symptoms: Secondary | ICD-10-CM | POA: Diagnosis present

## 2023-11-19 DIAGNOSIS — T81718A Complication of other artery following a procedure, not elsewhere classified, initial encounter: Principal | ICD-10-CM | POA: Diagnosis present

## 2023-11-19 DIAGNOSIS — I82890 Acute embolism and thrombosis of other specified veins: Secondary | ICD-10-CM

## 2023-11-19 DIAGNOSIS — N183 Chronic kidney disease, stage 3 unspecified: Secondary | ICD-10-CM

## 2023-11-19 LAB — COMPREHENSIVE METABOLIC PANEL WITH GFR
ALT: 18 U/L (ref 0–44)
AST: 19 U/L (ref 15–41)
Albumin: 3.6 g/dL (ref 3.5–5.0)
Alkaline Phosphatase: 75 U/L (ref 38–126)
Anion gap: 11 (ref 5–15)
BUN: 16 mg/dL (ref 8–23)
CO2: 25 mmol/L (ref 22–32)
Calcium: 10.5 mg/dL — ABNORMAL HIGH (ref 8.9–10.3)
Chloride: 99 mmol/L (ref 98–111)
Creatinine, Ser: 1.65 mg/dL — ABNORMAL HIGH (ref 0.61–1.24)
GFR, Estimated: 47 mL/min — ABNORMAL LOW (ref >60)
Glucose, Bld: 123 mg/dL — ABNORMAL HIGH (ref 70–99)
Potassium: 3.8 mmol/L (ref 3.5–5.1)
Sodium: 135 mmol/L (ref 135–145)
Total Bilirubin: 0.8 mg/dL (ref 0.0–1.2)
Total Protein: 7.8 g/dL (ref 6.5–8.1)

## 2023-11-19 LAB — URINALYSIS, ROUTINE W REFLEX MICROSCOPIC
Bilirubin Urine: NEGATIVE
Glucose, UA: NEGATIVE mg/dL
Ketones, ur: NEGATIVE mg/dL
Nitrite: POSITIVE — AB
Protein, ur: 100 mg/dL — AB
RBC / HPF: >50 RBC/hpf (ref 0–5)
Specific Gravity, Urine: 1.013 (ref 1.005–1.030)
WBC, UA: >50 WBC/hpf (ref 0–5)
pH: 5 (ref 5.0–8.0)

## 2023-11-19 LAB — BRAIN NATRIURETIC PEPTIDE: B Natriuretic Peptide: 54.2 pg/mL (ref 0.0–100.0)

## 2023-11-19 LAB — CBC
HCT: 40.4 % (ref 39.0–52.0)
Hemoglobin: 14 g/dL (ref 13.0–17.0)
MCH: 30.4 pg (ref 26.0–34.0)
MCHC: 34.7 g/dL (ref 30.0–36.0)
MCV: 87.8 fL (ref 80.0–100.0)
Platelets: 259 10*3/uL (ref 150–400)
RBC: 4.6 MIL/uL (ref 4.22–5.81)
RDW: 13.4 % (ref 11.5–15.5)
WBC: 9.3 10*3/uL (ref 4.0–10.5)
nRBC: 0 % (ref 0.0–0.2)

## 2023-11-19 LAB — TROPONIN I (HIGH SENSITIVITY)
Troponin I (High Sensitivity): 6 ng/L (ref <18)
Troponin I (High Sensitivity): 7 ng/L (ref <18)

## 2023-11-19 LAB — LIPASE, BLOOD: Lipase: 22 U/L (ref 11–51)

## 2023-11-19 LAB — LACTIC ACID, PLASMA: Lactic Acid, Venous: 0.8 mmol/L (ref 0.5–1.9)

## 2023-11-19 MED ORDER — HEPARIN BOLUS VIA INFUSION
2700.0000 [IU] | Freq: Once | INTRAVENOUS | Status: AC
  Administered 2023-11-19: 2700 [IU] via INTRAVENOUS
  Filled 2023-11-19: qty 2700

## 2023-11-19 MED ORDER — LEVOFLOXACIN IN D5W 500 MG/100ML IV SOLN
500.0000 mg | INTRAVENOUS | Status: DC
  Administered 2023-11-19 – 2023-11-20 (×2): 500 mg via INTRAVENOUS
  Filled 2023-11-19 (×2): qty 100

## 2023-11-19 MED ORDER — FENTANYL CITRATE PF 50 MCG/ML IJ SOSY
50.0000 ug | PREFILLED_SYRINGE | INTRAMUSCULAR | Status: DC | PRN
  Administered 2023-11-19: 50 ug via INTRAVENOUS
  Filled 2023-11-19: qty 1

## 2023-11-19 MED ORDER — IOHEXOL 350 MG/ML SOLN
100.0000 mL | Freq: Once | INTRAVENOUS | Status: AC | PRN
  Administered 2023-11-19: 100 mL via INTRAVENOUS

## 2023-11-19 MED ORDER — SODIUM CHLORIDE 0.9% FLUSH
3.0000 mL | Freq: Two times a day (BID) | INTRAVENOUS | Status: DC
  Administered 2023-11-19 – 2023-11-20 (×2): 3 mL via INTRAVENOUS

## 2023-11-19 MED ORDER — LACTATED RINGERS IV BOLUS
1000.0000 mL | Freq: Once | INTRAVENOUS | Status: AC
  Administered 2023-11-19: 1000 mL via INTRAVENOUS

## 2023-11-19 MED ORDER — HEPARIN (PORCINE) 25000 UT/250ML-% IV SOLN
1800.0000 [IU]/h | INTRAVENOUS | Status: DC
  Administered 2023-11-19: 1600 [IU]/h via INTRAVENOUS
  Administered 2023-11-20: 1800 [IU]/h via INTRAVENOUS
  Filled 2023-11-19 (×2): qty 250

## 2023-11-19 MED ORDER — HYDROCODONE-ACETAMINOPHEN 5-325 MG PO TABS: 1.0000 | ORAL_TABLET | Freq: Four times a day (QID) | ORAL | Status: DC | PRN

## 2023-11-19 MED ORDER — BICTEGRAVIR-EMTRICITAB-TENOFOV 50-200-25 MG PO TABS
1.0000 | ORAL_TABLET | Freq: Every day | ORAL | Status: DC
  Administered 2023-11-20 – 2023-11-22 (×3): 1 via ORAL
  Filled 2023-11-19 (×3): qty 1

## 2023-11-19 MED ORDER — ACETAMINOPHEN 650 MG RE SUPP: 650.0000 mg | Freq: Four times a day (QID) | RECTAL | Status: DC | PRN

## 2023-11-19 MED ORDER — ACETAMINOPHEN 500 MG PO TABS
1000.0000 mg | ORAL_TABLET | Freq: Once | ORAL | Status: AC
  Administered 2023-11-19: 1000 mg via ORAL
  Filled 2023-11-19: qty 2

## 2023-11-19 MED ORDER — ACETAMINOPHEN 325 MG PO TABS: 650.0000 mg | ORAL_TABLET | Freq: Four times a day (QID) | ORAL | Status: DC | PRN

## 2023-11-19 MED ORDER — SODIUM CHLORIDE 0.9 % IV SOLN
2.0000 g | Freq: Once | INTRAVENOUS | Status: AC
  Administered 2023-11-20: 2 g via INTRAVENOUS
  Filled 2023-11-19: qty 20

## 2023-11-19 MED ORDER — SODIUM CHLORIDE 0.9 % IV SOLN
2.0000 g | Freq: Two times a day (BID) | INTRAVENOUS | Status: DC
  Administered 2023-11-19: 2 g via INTRAVENOUS
  Filled 2023-11-19: qty 12.5

## 2023-11-19 MED ORDER — VANCOMYCIN HCL 1500 MG/300ML IV SOLN: 1500.0000 mg | INTRAVENOUS | Status: DC

## 2023-11-19 MED ORDER — VANCOMYCIN HCL 2000 MG/400ML IV SOLN
2000.0000 mg | Freq: Once | INTRAVENOUS | Status: AC
  Administered 2023-11-19: 2000 mg via INTRAVENOUS
  Filled 2023-11-19: qty 400

## 2023-11-19 MED ORDER — POLYETHYLENE GLYCOL 3350 17 G PO PACK: 17.0000 g | PACK | Freq: Every day | ORAL | Status: DC | PRN

## 2023-11-19 NOTE — ED Triage Notes (Signed)
 Pt reports prostate surgery over a week ago. Endorses now having testicular swelling. States difficulty with urine flow. He reports having a fever last night. Blood in urine still from post op

## 2023-11-19 NOTE — Assessment & Plan Note (Signed)
 On admission Chronic. Will send work up in AM  11-20-2023 vitamin D level is now. PTH pending  11-21-2023 resolved   11-22-2023 discharge calcium of 10.1

## 2023-11-19 NOTE — Assessment & Plan Note (Signed)
 See on imaging . Check urine culture. Abx as above.

## 2023-11-19 NOTE — Assessment & Plan Note (Signed)
 On right with  pulm embolism. Started on heaprin infusion - c.w. same. Patient advised to report any new symptoms headche, skin bruising, severe back pain to staff promptly.

## 2023-11-19 NOTE — ED Provider Notes (Addendum)
 [place] EMERGENCY DEPARTMENT AT [facility] Provider Note   CSN: [id number] Arrival date & time: 11/19/23  1144     History  Chief Complaint  Patient presents with   Testicle Pain   Groin Swelling    Billy [name] is a 63 y.o. male.   Testicle Pain Associated symptoms include chest pain and shortness of breath.     63 year old male with medical history significant for HIV, Kaposi's sarcoma, depression, GERD, erectile dysfunction, BPH now 3 weeks status post robotic assisted simple prostatectomy on 10/25/2023 presenting with concern for worsening symptoms of scrotal cellulitis, right leg swelling, chest pain and shortness of breath.  The patient states that he been doing well postoperatively.  Over the last 5 days he has had worsening testicular and scrotal pain bilaterally.  He has had worsening swelling.  He was placed on doxycycline and ciprofloxacin outpatient and has been taking the medication for the past 5 days.  He endorses a dry cough with a associated pleuritic chest discomfort and shortness of breath.  He has had swelling in his right lower extremity as well.  He is not on anticoagulation.  Home Medications Prior to Admission medications   Medication Sig Start Date End Date Taking? Authorizing Provider  bictegravir-emtricitabine-tenofovir AF (BIKTARVY) 50-200-25 MG TABS tablet Take 1 tablet by mouth daily. 11/12/23   Danelle Earthly, MD  docusate sodium (COLACE) 100 MG capsule Take 1 capsule (100 mg total) by mouth 2 (two) times daily. 10/25/23   Harrie Foreman, PA-C  doxycycline (VIBRA-TABS) 100 MG tablet Take 1 tablet (100 mg total) by mouth 2 (two) times daily. 11/13/23   Danelle Earthly, MD  HYDROcodone-acetaminophen (NORCO/VICODIN) 5-325 MG tablet Take 1-2 tablets by mouth every 6 (six) hours as needed for moderate pain (pain score 4-6) or severe pain (pain score 7-10). 10/25/23   Harrie Foreman, PA-C  sulfamethoxazole-trimethoprim (BACTRIM DS) 800-160 MG tablet  Take 1 tablet by mouth 2 (two) times daily. Start the day prior to foley removal appointment 10/25/23   Harrie Foreman, PA-C      Allergies    Patient has no known allergies.    Review of Systems   Review of Systems  Respiratory:  Positive for shortness of breath.   Cardiovascular:  Positive for chest pain.  Genitourinary:  Positive for dysuria, scrotal swelling and testicular pain.  All other systems reviewed and are negative.   Physical Exam Updated Vital Signs BP (!) 133/90 (BP Location: Right Arm)   Pulse 74   Temp 99.3 F (37.4 C) (Oral)   Resp (!) 23   Ht 6\' 2"  (1.88 m)   Wt 91 kg   SpO2 99%   BMI 25.76 kg/m  Physical Exam Vitals and nursing note reviewed.  Constitutional:      General: He is not in acute distress.    Appearance: He is well-developed.  HENT:     Head: Normocephalic and atraumatic.  Eyes:     Conjunctiva/sclera: Conjunctivae normal.  Cardiovascular:     Rate and Rhythm: Normal rate and regular rhythm.  Pulmonary:     Effort: Pulmonary effort is normal. No respiratory distress.     Breath sounds: Normal breath sounds.  Abdominal:     Palpations: Abdomen is soft.     Tenderness: There is no abdominal tenderness.  Genitourinary:    Comments: Skin thickening of the scrotum with surrounding tenderness, bilateral testicles hard and painful no crepitus palpated, suprapubic tenderness noted as well Musculoskeletal:  General: No swelling.     Cervical back: Neck supple.  Skin:    General: Skin is warm and dry.     Capillary Refill: Capillary refill takes less than 2 seconds.  Neurological:     Mental Status: He is alert.  Psychiatric:        Mood and Affect: Mood normal.     ED Results / Procedures / Treatments   Labs (all labs ordered are listed, but only abnormal results are displayed) Labs Reviewed  COMPREHENSIVE METABOLIC PANEL WITH GFR - Abnormal; Notable for the following components:      Result Value   Glucose, Bld 123 (*)     Creatinine, Ser 1.65 (*)    Calcium 10.5 (*)    GFR, Estimated 47 (*)    All other components within normal limits  URINALYSIS, ROUTINE W REFLEX MICROSCOPIC - Abnormal; Notable for the following components:   Color, Urine AMBER (*)    APPearance TURBID (*)    Hgb urine dipstick MODERATE (*)    Protein, ur 100 (*)    Nitrite POSITIVE (*)    Leukocytes,Ua LARGE (*)    Bacteria, UA RARE (*)    All other components within normal limits  CULTURE, BLOOD (ROUTINE X 2)  CULTURE, BLOOD (ROUTINE X 2)  RESP PANEL BY RT-PCR (RSV, FLU A&B, COVID)  RVPGX2  URINE CULTURE  LIPASE, BLOOD  CBC  BRAIN NATRIURETIC PEPTIDE  LACTIC ACID, PLASMA  T-HELPER CELLS (CD4) COUNT (NOT AT [facility])  CBC  HEPARIN LEVEL (UNFRACTIONATED)  CALCIUM, IONIZED  PHOSPHORUS  PARATHYROID HORMONE, INTACT (NO CA)  VITAMIN D 25 HYDROXY (VIT D DEFICIENCY, FRACTURES)  APTT  PROTIME-INR  BASIC METABOLIC PANEL WITH GFR  GC/CHLAMYDIA PROBE AMP ([place]) NOT AT [facility]  TROPONIN I (HIGH SENSITIVITY)  TROPONIN I (HIGH SENSITIVITY)    EKG EKG Interpretation Date/Time:  Tuesday November 19 2023 18:59:18 EDT Ventricular Rate:  79 PR Interval:  138 QRS Duration:  101 QT Interval:  372 QTC Calculation: 427 R Axis:   -62  Text Interpretation: Sinus rhythm LAD, consider left anterior fascicular block ST elevation, consider anterolateral injury No STEMI Confirmed by Ernie Avena ([id number]) on 11/19/2023 7:33:23 PM  Radiology CT ABDOMEN PELVIS W CONTRAST Result Date: 11/19/2023 CLINICAL DATA:  High probability PE, recent surgery on prostate EXAM: CT ANGIOGRAPHY CHEST CT ABDOMEN AND PELVIS WITH CONTRAST TECHNIQUE: Multidetector CT imaging of the chest was performed using the standard protocol during bolus administration of intravenous contrast. Multiplanar CT image reconstructions and MIPs were obtained to evaluate the vascular anatomy. Multidetector CT imaging of the abdomen and pelvis was performed using the standard protocol during  bolus administration of intravenous contrast. RADIATION DOSE REDUCTION: This exam was performed according to the departmental dose-optimization program which includes automated exposure control, adjustment of the mA and/or kV according to patient size and/or use of iterative reconstruction technique. CONTRAST:  OMNIPAQUE IOHEXOL 350 MG/ML SOLN COMPARISON:  MRI 11/03/2021 FINDINGS: CTA CHEST FINDINGS Cardiovascular: Satisfactory opacification of the pulmonary arteries to the segmental level. No evidence of pulmonary embolism. Positive for small volume acute bilateral pulmonary emboli. Small volume thrombus within distal left upper pulmonary artery with multiple small segmental and subsegmental left upper lobe pulmonary emboli. Small volume right upper lobe segmental and subsegmental and right lower lobe subsegmental pulmonary emboli. RV LV ratio 0.97. Nonaneurysmal aorta. Normal cardiac size. No pericardial effusion Mediastinum/Nodes: Patent trachea. No thyroid mass. No suspicious lymph nodes. Esophagus within normal limits. Lungs/Pleura: No pleural effusion or  pneumothorax. Subsegmental atelectasis in the right upper lobe. Musculoskeletal: No acute osseous abnormality. Review of the MIP images confirms the above findings. CT ABDOMEN and PELVIS FINDINGS Hepatobiliary: Subcentimeter hypodensity in the liver too small to further characterize. No calcified gallstone or biliary dilatation Pancreas: Unremarkable. No pancreatic ductal dilatation or surrounding inflammatory changes. Spleen: Normal in size without focal abnormality. Adrenals/Urinary Tract: Adrenal glands are normal. Fullness of the renal collecting systems without obstructing stone. Mild distal urothelial thickening and stranding. Slightly thick-walled urinary bladder with perivesical stranding and mucosal enhancement. Stomach/Bowel: Stomach is within normal limits. Appendix appears normal. No evidence of bowel wall thickening, distention, or  inflammatory changes. Vascular/Lymphatic: Aortic atherosclerosis. No enlarged abdominal or pelvic lymph nodes. Reproductive: Status post prostatectomy. Mild stranding between the bladder and seminal vesicles and surrounding the seminal vesicles. Large bilateral scrotal hydroceles and suspected diffuse scrotal thickening. No soft tissue gas at the scrotum or perineum to suggest necrotic infection Other: No free air Musculoskeletal: No acute osseous abnormality Review of the MIP images confirms the above findings. IMPRESSION: 1. Positive for small volume acute bilateral pulmonary emboli. Slightly elevated RV LV ratio 0.97. 2. Status post prostatectomy. Mild stranding between the bladder and seminal vesicles and surrounding the seminal vesicles, question postsurgical change versus infection. Slightly thick-walled urinary bladder with perivesical stranding and mucosal enhancement, suspicious for cystitis. Mild distal urothelial thickening and stranding, possible ascending urinary tract infection. No evidence for rim enhancing abscess at the surgical bed 3. Large bilateral scrotal hydroceles and suspected diffuse scrotal thickening. No soft tissue gas at the scrotum or perineum to suggest necrotic infection. Consider correlation with scrotal ultrasound 4. Aortic atherosclerosis. Critical Value/emergent results were called by telephone at the time of interpretation on 11/19/2023 at 7:06 Pm to provider [facility] , who verbally acknowledged these results. Electronically Signed   By: Jasmine Pang M.D.   On: 11/19/2023 20:09   CT Angio Chest Pulmonary Embolism (PE) W or WO Contrast Result Date: 11/19/2023 CLINICAL DATA:  High probability PE, recent surgery on prostate EXAM: CT ANGIOGRAPHY CHEST CT ABDOMEN AND PELVIS WITH CONTRAST TECHNIQUE: Multidetector CT imaging of the chest was performed using the standard protocol during bolus administration of intravenous contrast. Multiplanar CT image reconstructions and MIPs were  obtained to evaluate the vascular anatomy. Multidetector CT imaging of the abdomen and pelvis was performed using the standard protocol during bolus administration of intravenous contrast. RADIATION DOSE REDUCTION: This exam was performed according to the departmental dose-optimization program which includes automated exposure control, adjustment of the mA and/or kV according to patient size and/or use of iterative reconstruction technique. CONTRAST:  OMNIPAQUE IOHEXOL 350 MG/ML SOLN COMPARISON:  MRI 11/03/2021 FINDINGS: CTA CHEST FINDINGS Cardiovascular: Satisfactory opacification of the pulmonary arteries to the segmental level. No evidence of pulmonary embolism. Positive for small volume acute bilateral pulmonary emboli. Small volume thrombus within distal left upper pulmonary artery with multiple small segmental and subsegmental left upper lobe pulmonary emboli. Small volume right upper lobe segmental and subsegmental and right lower lobe subsegmental pulmonary emboli. RV LV ratio 0.97. Nonaneurysmal aorta. Normal cardiac size. No pericardial effusion Mediastinum/Nodes: Patent trachea. No thyroid mass. No suspicious lymph nodes. Esophagus within normal limits. Lungs/Pleura: No pleural effusion or pneumothorax. Subsegmental atelectasis in the right upper lobe. Musculoskeletal: No acute osseous abnormality. Review of the MIP images confirms the above findings. CT ABDOMEN and PELVIS FINDINGS Hepatobiliary: Subcentimeter hypodensity in the liver too small to further characterize. No calcified gallstone or biliary dilatation Pancreas: Unremarkable. No pancreatic ductal  dilatation or surrounding inflammatory changes. Spleen: Normal in size without focal abnormality. Adrenals/Urinary Tract: Adrenal glands are normal. Fullness of the renal collecting systems without obstructing stone. Mild distal urothelial thickening and stranding. Slightly thick-walled urinary bladder with perivesical stranding and mucosal  enhancement. Stomach/Bowel: Stomach is within normal limits. Appendix appears normal. No evidence of bowel wall thickening, distention, or inflammatory changes. Vascular/Lymphatic: Aortic atherosclerosis. No enlarged abdominal or pelvic lymph nodes. Reproductive: Status post prostatectomy. Mild stranding between the bladder and seminal vesicles and surrounding the seminal vesicles. Large bilateral scrotal hydroceles and suspected diffuse scrotal thickening. No soft tissue gas at the scrotum or perineum to suggest necrotic infection Other: No free air Musculoskeletal: No acute osseous abnormality Review of the MIP images confirms the above findings. IMPRESSION: 1. Positive for small volume acute bilateral pulmonary emboli. Slightly elevated RV LV ratio 0.97. 2. Status post prostatectomy. Mild stranding between the bladder and seminal vesicles and surrounding the seminal vesicles, question postsurgical change versus infection. Slightly thick-walled urinary bladder with perivesical stranding and mucosal enhancement, suspicious for cystitis. Mild distal urothelial thickening and stranding, possible ascending urinary tract infection. No evidence for rim enhancing abscess at the surgical bed 3. Large bilateral scrotal hydroceles and suspected diffuse scrotal thickening. No soft tissue gas at the scrotum or perineum to suggest necrotic infection. Consider correlation with scrotal ultrasound 4. Aortic atherosclerosis. Critical Value/emergent results were called by telephone at the time of interpretation on 11/19/2023 at 7:06 Pm to provider [facility] , who verbally acknowledged these results. Electronically Signed   By: Jasmine Pang M.D.   On: 11/19/2023 20:09   VAS [place] LOWER EXTREMITY VENOUS (DVT) (7a-7p) Result Date: 11/19/2023  Lower Venous DVT Study Patient Name:  Billy [name]  Date of Exam:   11/19/2023 Medical Rec #: [id number]            Accession #:    [id number] Date of Birth: 07-25-1961             Patient  Gender: M Patient Age:   36 years Exam Location:  [facility] Procedure:      VAS [place] LOWER EXTREMITY VENOUS (DVT) Referring Phys: Asher Muir BARRETT --------------------------------------------------------------------------------  Indications: Pain.  Risk Factors: Recent prostate surgery and hernia repair (10/25/2023). Comparison Study: No previous exams Performing Technologist: Jody Hill RVT, RDMS  Examination Guidelines: A complete evaluation includes B-mode imaging, spectral Doppler, color Doppler, and power Doppler as needed of all accessible portions of each vessel. Bilateral testing is considered an integral part of a complete examination. Limited examinations for reoccurring indications may be performed as noted. The reflux portion of the exam is performed with the patient in reverse Trendelenburg.  +---------+---------------+---------+-----------+---------------+--------------+ RIGHT    CompressibilityPhasicitySpontaneityProperties     Thrombus Aging +---------+---------------+---------+-----------+---------------+--------------+ CFV      Full                                                             +---------+---------------+---------+-----------+---------------+--------------+ SFJ      Full                                                             +---------+---------------+---------+-----------+---------------+--------------+  FV Prox  Full                                                             +---------+---------------+---------+-----------+---------------+--------------+ FV Mid   Full                                                             +---------+---------------+---------+-----------+---------------+--------------+ FV DistalFull                                                             +---------+---------------+---------+-----------+---------------+--------------+ PFV      Full                                                              +---------+---------------+---------+-----------+---------------+--------------+ POP      Full                                                             +---------+---------------+---------+-----------+---------------+--------------+ PTV      Full                                                             +---------+---------------+---------+-----------+---------------+--------------+ PERO                                                       Not well                                                                  visualized     +---------+---------------+---------+-----------+---------------+--------------+ GSV      None           No       No         mid calf to    Acute  ankle                         +---------+---------------+---------+-----------+---------------+--------------+ VV's     None           No       No         Distal medial  Acute                                                      thigh & mid                                                               calf                          +---------+---------------+---------+-----------+---------------+--------------+   +----+---------------+---------+-----------+----------+--------------+ LEFTCompressibilityPhasicitySpontaneityPropertiesThrombus Aging +----+---------------+---------+-----------+----------+--------------+ CFV Full           Yes      Yes                                 +----+---------------+---------+-----------+----------+--------------+     Summary: RIGHT: - Findings consistent with acute superficial vein thrombosis involving the right great saphenous vein, and right varicosities or other superficial veins.  - There is no evidence of deep vein thrombosis in the lower extremity.  - No cystic structure found in the popliteal fossa.  LEFT: - No evidence of common femoral vein obstruction.   *See table(s)  above for measurements and observations. Electronically signed by Lemar Livings MD on 11/19/2023 at 7:46:54 PM.    Final    DG Chest 2 View Result Date: 11/19/2023 CLINICAL DATA:  Shortness of breath and cough, recent prostate surgery 1 week ago, fever last night EXAM: CHEST - 2 VIEW COMPARISON:  04/06/2019 FINDINGS: The heart size and mediastinal contours are within normal limits. Similar decreased lung volumes. Both lungs are clear. Negative for edema, effusion or pneumothorax. Trachea midline. The visualized skeletal structures are unremarkable. IMPRESSION: Low volume exam without acute process. Electronically Signed   By: Judie Petit.  Shick M.D.   On: 11/19/2023 16:31    Procedures .Critical Care  Performed by: Ernie Avena, MD Authorized by: Ernie Avena, MD   Critical care provider statement:    Critical care time (minutes):  65   Critical care was time spent personally by me on the following activities:  Development of treatment plan with patient or surrogate, discussions with consultants, evaluation of patient's response to treatment, examination of patient, ordering and review of laboratory studies, ordering and review of radiographic studies, ordering and performing treatments and interventions, pulse oximetry, re-evaluation of patient's condition and review of old charts   Care discussed with: admitting provider       Medications Ordered in ED Medications  fentaNYL (SUBLIMAZE) injection 50 mcg (50 mcg Intravenous Given 11/19/23 1752)  vancomycin (VANCOREADY) IVPB 2000 mg/400 mL (0 mg Intravenous Stopped 11/19/23 2033)    Followed by  vancomycin (VANCOREADY) IVPB 1500 mg/300 mL (has no administration in time range)  heparin ADULT  infusion 100 units/mL (25000 units/25mL) (1,600 Units/hr Intravenous New Bag/Given 11/19/23 2032)  cefTRIAXone (ROCEPHIN) 2 g in sodium chloride 0.9 % 100 mL IVPB (has no administration in time range)  levofloxacin (LEVAQUIN) IVPB 500 mg (has no administration in time  range)  HYDROcodone-acetaminophen (NORCO/VICODIN) 5-325 MG per tablet 1-2 tablet (has no administration in time range)  bictegravir-emtricitabine-tenofovir AF (BIKTARVY) 50-200-25 MG per tablet 1 tablet (has no administration in time range)  acetaminophen (TYLENOL) tablet 650 mg (has no administration in time range)    Or  acetaminophen (TYLENOL) suppository 650 mg (has no administration in time range)  polyethylene glycol (MIRALAX / GLYCOLAX) packet 17 g (has no administration in time range)  sodium chloride flush (NS) 0.9 % injection 3 mL (has no administration in time range)  lactated ringers bolus 1,000 mL (0 mLs Intravenous Stopped 11/19/23 1900)  iohexol (OMNIPAQUE) 350 MG/ML injection 100 mL (100 mLs Intravenous Contrast Given 11/19/23 1811)  acetaminophen (TYLENOL) tablet 1,000 mg (1,000 mg Oral Given 11/19/23 1857)  heparin bolus via infusion 2,700 Units (2,700 Units Intravenous Bolus from Bag 11/19/23 2033)    ED Course/ Medical Decision Making/ A&P Clinical Course as of 11/19/23 2222  Tue Nov 19, 2023  1559 Creatinine(!): 1.65 [JL]  1600 Nitrite(!): POSITIVE [JL]  1600 Leukocytes,Ua(!): LARGE [JL]  1600 WBC, UA: >50 [JL]  1600 Bacteria, UA(!): RARE [JL]    Clinical Course User Index [JL] Ernie Avena, MD                                 Medical Decision Making Amount and/or Complexity of Data Reviewed Labs: ordered. Decision-making details documented in ED Course. Radiology: ordered.  Risk OTC drugs. Prescription drug management. Decision regarding hospitalization.    63 year old male with medical history significant for HIV, Kaposi's sarcoma, depression, GERD, erectile dysfunction, BPH now 3 weeks status post robotic assisted simple prostatectomy on 10/25/2023 presenting with concern for worsening symptoms of scrotal cellulitis, right leg swelling, chest pain and shortness of breath.  The patient states that he been doing well postoperatively.  Over the last 5 days he has  had worsening testicular and scrotal pain bilaterally.  He has had worsening swelling.  He was placed on doxycycline and ciprofloxacin outpatient and has been taking the medication for the past 5 days.  He endorses a dry cough with a associated pleuritic chest discomfort and shortness of breath.  He has had swelling in his right lower extremity as well.  He is not on anticoagulation.  On arrival, the patient was afebrile, not tachycardic or tachypneic, hemodynamically stable, saturating well on room air.  Physical exam revealed evidence of a scrotal cellulitis with may be concern for orchitis.  Lower concern for Fournier's gangrene, no crepitus palpated on exam.  Symptoms been ongoing for several days and have not rapidly worsened.  Patient not meeting SIRS criteria on arrival.  Does endorse right lower extremity swelling and pain that is been present over the past few days with new onset chest pain and shortness of breath.  Considered DVT, considered PE.  Of note, the patient has failed outpatient management for scrotal cellulitis with failed treatment with doxycycline and ciprofloxacin for the past 5 days.  EKG revealed sinus rhythm, ventricular rate 79, nonspecific ST changes anteriorly, no STEMI.  The chest x-ray was unremarkable.  A lower extremity DVT study was performed which revealed acute superficial vein thrombosis involving the right saphenous vein, right  varicosities with no evidence of DVT, no evidence of DVT in the left common femoral vein.  CTA PE study was obtained which was positive for acute PE with very mild evidence of right heart strain with an RV to LV ratio of 0.97.  The patient has a normal cardiac troponin, normal lactic acid and a normal BNP.  I did discuss the care of the patient with on-call pulmonary critical care, Dr. Vassie Loll who agreed no indication for acute intervention at this time, stable for hospitalist admission, if the patient clinically worsens or has worsening findings of  right heart failure on inpatient echocardiogram, can reengage pulmonology inpatient. IMPRESSION:  1. Positive for small volume acute bilateral pulmonary emboli.  Slightly elevated RV LV ratio 0.97.  2. Status post prostatectomy. Mild stranding between the bladder and  seminal vesicles and surrounding the seminal vesicles, question  postsurgical change versus infection. Slightly thick-walled urinary  bladder with perivesical stranding and mucosal enhancement,  suspicious for cystitis. Mild distal urothelial thickening and  stranding, possible ascending urinary tract infection. No evidence  for rim enhancing abscess at the surgical bed  3. Large bilateral scrotal hydroceles and suspected diffuse scrotal  thickening. No soft tissue gas at the scrotum or perineum to suggest  necrotic infection. Consider correlation with scrotal ultrasound  4. Aortic atherosclerosis.    Critical Value/emergent results were called by telephone at the time  of interpretation on 11/19/2023 at 7:06 Pm to provider [facility] ,  who verbally acknowledged these results.   CT of the abdomen pelvis was performed which revealed bilateral scrotal hydroceles and suspected diffuse scrotal thickening with no soft tissue tissue gas at the scrotum or perineum to suggest necrotic infection.  Consider correlation with scrotal ultrasound which was obtained.  Hospitalist medicine was consulted for admission.  Urology was consulted given the patient was postoperative with now evidence of scrotal cellulitis with potential bilateral scrotal hydroceles raising concern for potential developing scrotal abscess, spoke with Dr. Margo Aye.  Patient and family were updated regarding results of diagnostic testing, updated regarding the plan for IV antibiotics inpatient, IV heparin administration and inpatient hospitalization.  All questions were answered. Dr. Maryjean Ka accepted the patient in admission.   Final Clinical Impression(s) / ED  Diagnoses Final diagnoses:  Cellulitis of scrotum  Acute pulmonary embolism, unspecified pulmonary embolism type, unspecified whether acute cor pulmonale present [facility])    Rx / DC Orders ED Discharge Orders     None             Ernie Avena, MD 11/19/23 2223

## 2023-11-19 NOTE — Progress Notes (Signed)
 Pharmacy Antibiotic Note  Billy [name] is a 63 y.o. male admitted on 11/19/2023 with sepsis.  Pharmacy has been consulted for vancomycin and cefepime dosing.  Patient reports having surgery over a week ago and now endorses testicular swelling--possible cellulitis. He reports taking doxycycline outpatient x5 days but now presenting with worsening scrotal swelling.   Plan: Start cefepime 2g IV q12h  Give vancomycin 2000mg  IV x1 now, followed by vanc 1500mg  IV q24h (eAUC 460 using Scr 1.65, IBW, and Vd 0.72) Vanc levels PRN Monitor renal function, cultures, and overall clinical picture De-escalate antibiotics as able   Height: 6\' 2"  (188 cm) Weight: 91 kg (200 lb 9.9 oz) IBW/kg (Calculated) : 82.2  Temp (24hrs), Avg:99 F (37.2 C), Min:98.9 F (37.2 C), Max:99.1 F (37.3 C)  Recent Labs  Lab 11/19/23 1200  WBC 9.3  CREATININE 1.65*    Estimated Creatinine Clearance: 54.7 mL/min (A) (by C-G formula based on SCr of 1.65 mg/dL (H)).    No Known Allergies  Antimicrobials this admission: 4/8 vancomycin >>  4/8 cefepime >>   Dose adjustments this admission: N/A  Microbiology results: 4/8 BCx: not yet collected   Thank you for allowing pharmacy to be a part of this patient's care.  Cherylin Mylar 11/19/2023 4:09 PM

## 2023-11-19 NOTE — Assessment & Plan Note (Signed)
 On admission. Left. S.p vanco+cefepime in the ER. Give one dose of ceftriaxone and rx with 10 days of levoflxacin. GC probe pending for urine. I found the left testicle pretty hard. Look forward to Dr. Margo Aye (urology eval in this regard)  11-20-2023 change abx to levaquin. Discussed with urology. No operative intervention needed.   11-21-2023 changed to p.o. Levaquin.  Awaiting urine culture.  Gonorrhea and Chlamydia probe are negative.  11-22-2023 urine culture grew E. coli.  Discharged to home on Levaquin.  Urology wanted 14 days of therapy.  Will discharge him on additional 11 days of Levaquin.

## 2023-11-19 NOTE — Assessment & Plan Note (Addendum)
 On admission. Heparin infusion started. I do not see indication for thrombolysis at thsi time. No cor pulmonale. Check echo.  11-20-2023 echo negative for RV dysfunction.  He only has superficial thrombophlebitis on his lower extremity ultrasounds.  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  Stop IV heparin.  Change over to p.o. Eliquis.  11-21-2023 tolerating Eliquis.  On room air.  11-22-2023 discharged to home on Eliquis.  He will need least 6 months of therapy.  He will need to see his PCP in the office in 1 to 2 weeks to get a refill on his Eliquis.

## 2023-11-19 NOTE — H&P (Signed)
 History and Physical    PatientRuby [name] QVZ:[id number] DOB: [birth year] DOA: 11/19/2023 DOS: the patient was seen and examined on 11/19/2023 PCP: Montez Hageman, DO  Patient coming from: Home  Chief Complaint:  Chief Complaint  Patient presents with   Testicle Pain   Groin Swelling   HPI: Billy [name] is a 63 y.o. male with medical history significant of BPH LUTS s.p "simple prostatectomy" on 10/25/2023 robot assisted. Umbilical hernia repair.  Since then patinet reports some scrotal pain. Patient has recived outpatient doxycycline for same.  Paitnet returned to ER today due to several new symptoms : patinet apparently had a fever last night and difficulty with urination flow. No abd pain. Paitnet has had persistent minimal hematuria sicne the time of his surgery. No diarrhea, no vomting. Patient also reports new onset cough, sob and chest pain pleuritic since this AM . No expectoration. Chest pain only with cuohging. Sob with couhging. No leg swelling, no rash on skin. He also reports right lower extremity discomfort medial thigh distally.  Review of Systems: As mentioned in the history of present illness. All other systems reviewed and are negative. Past Medical History:  Diagnosis Date   Headache    few times a month   HIV (human immunodeficiency virus infection) (HCC)    Past Surgical History:  Procedure Laterality Date   EYE SURGERY Right    cosmetic eye   XI ROBOTIC ASSISTED SIMPLE PROSTATECTOMY N/A 10/25/2023   Procedure: PROSTATECTOMY, SIMPLE, ROBOT-ASSISTED;  Surgeon: Loletta Parish., MD;  Location: [facility];  Service: Urology;  Laterality: N/A;  180 MINUTES NEEDED FOR CASE   Social History:  reports that he has never smoked. He has never used smokeless tobacco. He reports that he does not drink alcohol and does not use drugs.  No Known Allergies  History reviewed. No pertinent family history.  Prior to Admission medications   Medication Sig Start  Date End Date Taking? Authorizing Provider  bictegravir-emtricitabine-tenofovir AF (BIKTARVY) 50-200-25 MG TABS tablet Take 1 tablet by mouth daily. 11/12/23   Danelle Earthly, MD  docusate sodium (COLACE) 100 MG capsule Take 1 capsule (100 mg total) by mouth 2 (two) times daily. 10/25/23   Harrie Foreman, PA-C  doxycycline (VIBRA-TABS) 100 MG tablet Take 1 tablet (100 mg total) by mouth 2 (two) times daily. 11/13/23   Danelle Earthly, MD  HYDROcodone-acetaminophen (NORCO/VICODIN) 5-325 MG tablet Take 1-2 tablets by mouth every 6 (six) hours as needed for moderate pain (pain score 4-6) or severe pain (pain score 7-10). 10/25/23   Harrie Foreman, PA-C  sulfamethoxazole-trimethoprim (BACTRIM DS) 800-160 MG tablet Take 1 tablet by mouth 2 (two) times daily. Start the day prior to foley removal appointment 10/25/23   Harrie Foreman, [place]    Physical Exam: Vitals:   11/19/23 1700 11/19/23 1830 11/19/23 1854 11/19/23 1922  BP: 118/79   (!) 133/90  Pulse: 87   74  Resp: 18   (!) 23  Temp:  (!) 101.2 F (38.4 C) (!) 101.1 F (38.4 C) 99.3 F (37.4 C)  TempSrc:  Oral Rectal Oral  SpO2: 100%   99%  Weight:      Height:       Geneal - aptient is AAOx3 , no distress apaprnet. Resp - b/l a/e vesicualr Cvs-s1s2 normal Abodmen osft non tedner Extrmeity warm no edmea. Scrotum - skin appears healthy to me. Left testicle is hard with focal superior tender point. Otherwise, external genitalia appears healthy to me. Data Reviewed:  Labs on Admission:  Results for orders placed or performed during the hospital encounter of 11/19/23 (from the past 24 hours)  Lipase, blood     Status: None   Collection Time: 11/19/23 12:00 PM  Result Value Ref Range   Lipase 22 11 - 51 U/L  Comprehensive metabolic panel     Status: Abnormal   Collection Time: 11/19/23 12:00 PM  Result Value Ref Range   Sodium 135 135 - 145 mmol/L   Potassium 3.8 3.5 - 5.1 mmol/L   Chloride 99 98 - 111 mmol/L   CO2 25 22 - 32 mmol/L    Glucose, Bld 123 (H) 70 - 99 mg/dL   BUN 16 8 - 23 mg/dL   Creatinine, Ser 1.61 (H) 0.61 - 1.24 mg/dL   Calcium 09.6 (H) 8.9 - 10.3 mg/dL   Total Protein 7.8 6.5 - 8.1 g/dL   Albumin 3.6 3.5 - 5.0 g/dL   AST 19 15 - 41 U/L   ALT 18 0 - 44 U/L   Alkaline Phosphatase 75 38 - 126 U/L   Total Bilirubin 0.8 0.0 - 1.2 mg/dL   GFR, Estimated 47 (L) >60 mL/min   Anion gap 11 5 - 15  CBC     Status: None   Collection Time: 11/19/23 12:00 PM  Result Value Ref Range   WBC 9.3 4.0 - 10.5 K/uL   RBC 4.60 4.22 - 5.81 MIL/uL   Hemoglobin 14.0 13.0 - 17.0 g/dL   HCT 04.5 40.9 - 81.1 %   MCV 87.8 80.0 - 100.0 fL   MCH 30.4 26.0 - 34.0 pg   MCHC 34.7 30.0 - 36.0 g/dL   RDW 91.4 78.2 - 95.6 %   Platelets 259 150 - 400 K/uL   nRBC 0.0 0.0 - 0.2 %  Urinalysis, Routine w reflex microscopic -Urine, Clean Catch     Status: Abnormal   Collection Time: 11/19/23 12:00 PM  Result Value Ref Range   Color, Urine AMBER (A) YELLOW   APPearance TURBID (A) CLEAR   Specific Gravity, Urine 1.013 1.005 - 1.030   pH 5.0 5.0 - 8.0   Glucose, UA NEGATIVE NEGATIVE mg/dL   Hgb urine dipstick MODERATE (A) NEGATIVE   Bilirubin Urine NEGATIVE NEGATIVE   Ketones, ur NEGATIVE NEGATIVE mg/dL   Protein, ur 213 (A) NEGATIVE mg/dL   Nitrite POSITIVE (A) NEGATIVE   Leukocytes,Ua LARGE (A) NEGATIVE   RBC / HPF >50 0 - 5 RBC/hpf   WBC, UA >50 0 - 5 WBC/hpf   Bacteria, UA RARE (A) NONE SEEN   Squamous Epithelial / HPF 0-5 0 - 5 /HPF   WBC Clumps PRESENT    Budding Yeast PRESENT   Troponin I (High Sensitivity)     Status: None   Collection Time: 11/19/23  5:00 PM  Result Value Ref Range   Troponin I (High Sensitivity) 6 <18 ng/L  Brain natriuretic peptide     Status: None   Collection Time: 11/19/23  5:00 PM  Result Value Ref Range   B Natriuretic Peptide 54.2 0.0 - 100.0 pg/mL  Lactic acid, plasma     Status: None   Collection Time: 11/19/23  7:34 PM  Result Value Ref Range   Lactic Acid, Venous 0.8 0.5 - 1.9  mmol/L   Basic Metabolic Panel: Recent Labs  Lab 11/19/23 1200  NA 135  K 3.8  CL 99  CO2 25  GLUCOSE 123*  BUN 16  CREATININE 1.65*  CALCIUM 10.5*  Liver Function Tests: Recent Labs  Lab 11/19/23 1200  AST 19  ALT 18  ALKPHOS 75  BILITOT 0.8  PROT 7.8  ALBUMIN 3.6   Recent Labs  Lab 11/19/23 1200  LIPASE 22   No results for input(s): "AMMONIA" in the last 168 hours. CBC: Recent Labs  Lab 11/19/23 1200  WBC 9.3  HGB 14.0  HCT 40.4  MCV 87.8  PLT 259   Cardiac Enzymes: Recent Labs  Lab 11/19/23 1700  TROPONINIHS 6    BNP (last 3 results) No results for input(s): "PROBNP" in the last 8760 hours. CBG: No results for input(s): "GLUCAP" in the last 168 hours.  Radiological Exams on Admission:  CT ABDOMEN PELVIS W CONTRAST Result Date: 11/19/2023 CLINICAL DATA:  High probability PE, recent surgery on prostate EXAM: CT ANGIOGRAPHY CHEST CT ABDOMEN AND PELVIS WITH CONTRAST TECHNIQUE: Multidetector CT imaging of the chest was performed using the standard protocol during bolus administration of intravenous contrast. Multiplanar CT image reconstructions and MIPs were obtained to evaluate the vascular anatomy. Multidetector CT imaging of the abdomen and pelvis was performed using the standard protocol during bolus administration of intravenous contrast. RADIATION DOSE REDUCTION: This exam was performed according to the departmental dose-optimization program which includes automated exposure control, adjustment of the mA and/or kV according to patient size and/or use of iterative reconstruction technique. CONTRAST:  OMNIPAQUE IOHEXOL 350 MG/ML SOLN COMPARISON:  MRI 11/03/2021 FINDINGS: CTA CHEST FINDINGS Cardiovascular: Satisfactory opacification of the pulmonary arteries to the segmental level. No evidence of pulmonary embolism. Positive for small volume acute bilateral pulmonary emboli. Small volume thrombus within distal left upper pulmonary artery with multiple  small segmental and subsegmental left upper lobe pulmonary emboli. Small volume right upper lobe segmental and subsegmental and right lower lobe subsegmental pulmonary emboli. RV LV ratio 0.97. Nonaneurysmal aorta. Normal cardiac size. No pericardial effusion Mediastinum/Nodes: Patent trachea. No thyroid mass. No suspicious lymph nodes. Esophagus within normal limits. Lungs/Pleura: No pleural effusion or pneumothorax. Subsegmental atelectasis in the right upper lobe. Musculoskeletal: No acute osseous abnormality. Review of the MIP images confirms the above findings. CT ABDOMEN and PELVIS FINDINGS Hepatobiliary: Subcentimeter hypodensity in the liver too small to further characterize. No calcified gallstone or biliary dilatation Pancreas: Unremarkable. No pancreatic ductal dilatation or surrounding inflammatory changes. Spleen: Normal in size without focal abnormality. Adrenals/Urinary Tract: Adrenal glands are normal. Fullness of the renal collecting systems without obstructing stone. Mild distal urothelial thickening and stranding. Slightly thick-walled urinary bladder with perivesical stranding and mucosal enhancement. Stomach/Bowel: Stomach is within normal limits. Appendix appears normal. No evidence of bowel wall thickening, distention, or inflammatory changes. Vascular/Lymphatic: Aortic atherosclerosis. No enlarged abdominal or pelvic lymph nodes. Reproductive: Status post prostatectomy. Mild stranding between the bladder and seminal vesicles and surrounding the seminal vesicles. Large bilateral scrotal hydroceles and suspected diffuse scrotal thickening. No soft tissue gas at the scrotum or perineum to suggest necrotic infection Other: No free air Musculoskeletal: No acute osseous abnormality Review of the MIP images confirms the above findings. IMPRESSION: 1. Positive for small volume acute bilateral pulmonary emboli. Slightly elevated RV LV ratio 0.97. 2. Status post prostatectomy. Mild stranding between  the bladder and seminal vesicles and surrounding the seminal vesicles, question postsurgical change versus infection. Slightly thick-walled urinary bladder with perivesical stranding and mucosal enhancement, suspicious for cystitis. Mild distal urothelial thickening and stranding, possible ascending urinary tract infection. No evidence for rim enhancing abscess at the surgical bed 3. Large bilateral scrotal hydroceles and suspected diffuse scrotal  thickening. No soft tissue gas at the scrotum or perineum to suggest necrotic infection. Consider correlation with scrotal ultrasound 4. Aortic atherosclerosis. Critical Value/emergent results were called by telephone at the time of interpretation on 11/19/2023 at 7:06 Pm to provider [facility] , who verbally acknowledged these results. Electronically Signed   By: Jasmine Pang M.D.   On: 11/19/2023 20:09   CT Angio Chest Pulmonary Embolism (PE) W or WO Contrast Result Date: 11/19/2023 CLINICAL DATA:  High probability PE, recent surgery on prostate EXAM: CT ANGIOGRAPHY CHEST CT ABDOMEN AND PELVIS WITH CONTRAST TECHNIQUE: Multidetector CT imaging of the chest was performed using the standard protocol during bolus administration of intravenous contrast. Multiplanar CT image reconstructions and MIPs were obtained to evaluate the vascular anatomy. Multidetector CT imaging of the abdomen and pelvis was performed using the standard protocol during bolus administration of intravenous contrast. RADIATION DOSE REDUCTION: This exam was performed according to the departmental dose-optimization program which includes automated exposure control, adjustment of the mA and/or kV according to patient size and/or use of iterative reconstruction technique. CONTRAST:  OMNIPAQUE IOHEXOL 350 MG/ML SOLN COMPARISON:  MRI 11/03/2021 FINDINGS: CTA CHEST FINDINGS Cardiovascular: Satisfactory opacification of the pulmonary arteries to the segmental level. No evidence of pulmonary embolism.  Positive for small volume acute bilateral pulmonary emboli. Small volume thrombus within distal left upper pulmonary artery with multiple small segmental and subsegmental left upper lobe pulmonary emboli. Small volume right upper lobe segmental and subsegmental and right lower lobe subsegmental pulmonary emboli. RV LV ratio 0.97. Nonaneurysmal aorta. Normal cardiac size. No pericardial effusion Mediastinum/Nodes: Patent trachea. No thyroid mass. No suspicious lymph nodes. Esophagus within normal limits. Lungs/Pleura: No pleural effusion or pneumothorax. Subsegmental atelectasis in the right upper lobe. Musculoskeletal: No acute osseous abnormality. Review of the MIP images confirms the above findings. CT ABDOMEN and PELVIS FINDINGS Hepatobiliary: Subcentimeter hypodensity in the liver too small to further characterize. No calcified gallstone or biliary dilatation Pancreas: Unremarkable. No pancreatic ductal dilatation or surrounding inflammatory changes. Spleen: Normal in size without focal abnormality. Adrenals/Urinary Tract: Adrenal glands are normal. Fullness of the renal collecting systems without obstructing stone. Mild distal urothelial thickening and stranding. Slightly thick-walled urinary bladder with perivesical stranding and mucosal enhancement. Stomach/Bowel: Stomach is within normal limits. Appendix appears normal. No evidence of bowel wall thickening, distention, or inflammatory changes. Vascular/Lymphatic: Aortic atherosclerosis. No enlarged abdominal or pelvic lymph nodes. Reproductive: Status post prostatectomy. Mild stranding between the bladder and seminal vesicles and surrounding the seminal vesicles. Large bilateral scrotal hydroceles and suspected diffuse scrotal thickening. No soft tissue gas at the scrotum or perineum to suggest necrotic infection Other: No free air Musculoskeletal: No acute osseous abnormality Review of the MIP images confirms the above findings. IMPRESSION: 1. Positive for  small volume acute bilateral pulmonary emboli. Slightly elevated RV LV ratio 0.97. 2. Status post prostatectomy. Mild stranding between the bladder and seminal vesicles and surrounding the seminal vesicles, question postsurgical change versus infection. Slightly thick-walled urinary bladder with perivesical stranding and mucosal enhancement, suspicious for cystitis. Mild distal urothelial thickening and stranding, possible ascending urinary tract infection. No evidence for rim enhancing abscess at the surgical bed 3. Large bilateral scrotal hydroceles and suspected diffuse scrotal thickening. No soft tissue gas at the scrotum or perineum to suggest necrotic infection. Consider correlation with scrotal ultrasound 4. Aortic atherosclerosis. Critical Value/emergent results were called by telephone at the time of interpretation on 11/19/2023 at 7:06 Pm to provider [facility] , who verbally acknowledged these results.  Electronically Signed   By: Jasmine Pang M.D.   On: 11/19/2023 20:09   VAS [place] LOWER EXTREMITY VENOUS (DVT) (7a-7p) Result Date: 11/19/2023  Lower Venous DVT Study Patient Name:  Billy [name]  Date of Exam:   11/19/2023 Medical Rec #: [id number]            Accession #:    [id number] Date of Birth: [birth year]             Patient Gender: M Patient Age:   36 years Exam Location:  [facility] Procedure:      VAS [place] LOWER EXTREMITY VENOUS (DVT) Referring Phys: Asher Muir BARRETT --------------------------------------------------------------------------------  Indications: Pain.  Risk Factors: Recent prostate surgery and hernia repair (10/25/2023). Comparison Study: No previous exams Performing Technologist: Jody Hill RVT, RDMS  Examination Guidelines: A complete evaluation includes B-mode imaging, spectral Doppler, color Doppler, and power Doppler as needed of all accessible portions of each vessel. Bilateral testing is considered an integral part of a complete examination. Limited examinations for  reoccurring indications may be performed as noted. The reflux portion of the exam is performed with the patient in reverse Trendelenburg.  +---------+---------------+---------+-----------+---------------+--------------+ RIGHT    CompressibilityPhasicitySpontaneityProperties     Thrombus Aging +---------+---------------+---------+-----------+---------------+--------------+ CFV      Full                                                             +---------+---------------+---------+-----------+---------------+--------------+ SFJ      Full                                                             +---------+---------------+---------+-----------+---------------+--------------+ FV Prox  Full                                                             +---------+---------------+---------+-----------+---------------+--------------+ FV Mid   Full                                                             +---------+---------------+---------+-----------+---------------+--------------+ FV DistalFull                                                             +---------+---------------+---------+-----------+---------------+--------------+ PFV      Full                                                             +---------+---------------+---------+-----------+---------------+--------------+  POP      Full                                                             +---------+---------------+---------+-----------+---------------+--------------+ PTV      Full                                                             +---------+---------------+---------+-----------+---------------+--------------+ PERO                                                       Not well                                                                  visualized     +---------+---------------+---------+-----------+---------------+--------------+ GSV      None            No       No         mid calf to    Acute                                                      ankle                         +---------+---------------+---------+-----------+---------------+--------------+ VV's     None           No       No         Distal medial  Acute                                                      thigh & mid                                                               calf                          +---------+---------------+---------+-----------+---------------+--------------+   +----+---------------+---------+-----------+----------+--------------+ LEFTCompressibilityPhasicitySpontaneityPropertiesThrombus Aging +----+---------------+---------+-----------+----------+--------------+ CFV Full           Yes      Yes                                 +----+---------------+---------+-----------+----------+--------------+  Summary: RIGHT: - Findings consistent with acute superficial vein thrombosis involving the right great saphenous vein, and right varicosities or other superficial veins.  - There is no evidence of deep vein thrombosis in the lower extremity.  - No cystic structure found in the popliteal fossa.  LEFT: - No evidence of common femoral vein obstruction.   *See table(s) above for measurements and observations. Electronically signed by Lemar Livings MD on 11/19/2023 at 7:46:54 PM.    Final    DG Chest 2 View Result Date: 11/19/2023 CLINICAL DATA:  Shortness of breath and cough, recent prostate surgery 1 week ago, fever last night EXAM: CHEST - 2 VIEW COMPARISON:  04/06/2019 FINDINGS: The heart size and mediastinal contours are within normal limits. Similar decreased lung volumes. Both lungs are clear. Negative for edema, effusion or pneumothorax. Trachea midline. The visualized skeletal structures are unremarkable. IMPRESSION: Low volume exam without acute process. Electronically Signed   By: Judie Petit.  Shick M.D.   On: 11/19/2023 16:31      I/O last 3 completed shifts: In: 76.7 [IV Piggyback:76.7] Out: -  Total I/O In: 387.1 [IV Piggyback:387.1] Out: -        Assessment and Plan: Acute pulmonary embolism (HCC) Heparin infusion started. I do not see indication for thrombolysis at thsi time. No cor pulmonale. Check echo.  Superficial vein thrombosis On right with  pulm embolism. Started on heaprin infusion - c.w. same. Patient advised to report any new symptoms headche, skin bruising, severe back pain to staff promptly.  Cystitis See on imaging . Check urine culture. Abx as above.  Orchitis Left. S.p vanco+cefepime in the ER. Give one dose of ceftriaxone and rx with 10 days of levoflxacin. GC probe pending for urine. I found the left testicle pretty hard. Look forward to Dr. Margo Aye (urology eval in this regard)  Hypercalcemia [place]. Will send work up in AM  CKD (chronic kidney disease) stage 3, GFR 30-59 ml/min (HCC) Moniotr creatinine.   HIV - c.w Biktarvy  Med rec pending pharmacy input.   Advance Care Planning:   Code Status: Prior full code per patient  Consults: urology engaged by ER provider.  Family Communication: wife at bedside. Careplan discussed.  Severity of Illness: The appropriate patient status for this patient is INPATIENT. Inpatient status is judged to be reasonable and necessary in order to provide the required intensity of service to ensure the patient's safety. The patient's presenting symptoms, physical exam findings, and initial radiographic and laboratory data in the context of their chronic comorbidities is felt to place them at high risk for further clinical deterioration. Furthermore, it is not anticipated that the patient will be medically stable for discharge from the hospital within 2 midnights of admission.   * I certify that at the point of admission it is my clinical judgment that the patient will require inpatient hospital care spanning beyond 2 midnights from the point  of admission due to high intensity of service, high risk for further deterioration and high frequency of surveillance required.*  Author: Nolberto Hanlon, MD 11/19/2023 9:31 PM  For on call review [email or web address].

## 2023-11-19 NOTE — Assessment & Plan Note (Signed)
 Moniotr creatinine.

## 2023-11-19 NOTE — Progress Notes (Signed)
 Pt is up to unit via stretcher with ED paramedic and wife at side. Pt is warm, dry, no visible distress.  Rm: [id number]

## 2023-11-19 NOTE — Progress Notes (Signed)
 PHARMACY - ANTICOAGULATION CONSULT NOTE  Pharmacy Consult for heparin Indication: pulmonary embolus  No Known Allergies  Patient Measurements: Height: 6\' 2"  (188 cm) Weight: 91 kg (200 lb 9.9 oz) IBW/kg (Calculated) : 82.2 HEPARIN DW (KG): 91  Vital Signs: Temp: 101.1 F (38.4 C) (04/08 1854) Temp Source: Rectal (04/08 1854) BP: 118/79 (04/08 1700) Pulse Rate: 87 (04/08 1700)  Labs: Recent Labs    11/19/23 1200 11/19/23 1700  HGB 14.0  --   HCT 40.4  --   PLT 259  --   CREATININE 1.65*  --   TROPONINIHS  --  6    Estimated Creatinine Clearance: 54.7 mL/min (A) (by C-G formula based on SCr of 1.65 mg/dL (H)).   Medical History: Past Medical History:  Diagnosis Date   Headache    few times a month   HIV (human immunodeficiency virus infection) (HCC)    Assessment: 63 YO male presenting with worsening symptoms of scrotal cellulitis, right leg swelling, chest pain and shortness of breath. Doppler showing acute superficial vein thrombosis. CTA PE positive for PE. Patient is not on anticoagulation PTA. Pharmacy has been consulted for heparin dosing.   Today, 11/19/23: Hgb 14, plts 259--stable Scr 1.65 (BL 1.3-1.4) ED nurse says patient reports blood in urine from recent post-op. No other s/sx of bleeding reported.   Goal of Therapy:  Heparin level 0.3-0.7 units/ml Monitor platelets by anticoagulation protocol: Yes   Plan:  Give heparin 2700 units IV x1 (per [place] nomogram) Start heparin gtt @1600  units/hr (per [place] nomogram) Check heparin level 8hrs after infusion starts Monitor heparin level, CBC, and s/sx of bleeding daily   Cherylin Mylar, PharmD Clinical Pharmacist  4/8/20257:25 PM

## 2023-11-19 NOTE — ED Provider Triage Note (Signed)
 Emergency Medicine Provider Triage Evaluation Note  Donivin Wirt , a 63 y.o. male  was evaluated in triage.  Pt complains of right leg swelling, pain 3 weeks,worsening varicose veins.  Urinary frequency and dysuria, testicular swelling for 5 days. Shortness of breath and cough. Cough is dry. Recent surgery. Generalized weakness for 3 days.   Review of Systems  Positive: SOB, cough Negative: Abdominal pain, CP  Physical Exam  BP 116/80 (BP Location: Right Arm)   Pulse 100   Temp 99.1 F (37.3 C) (Oral)   Resp 17   Ht 6\' 2"  (1.88 m)   Wt 91 kg   SpO2 98%   BMI 25.76 kg/m  Gen:   Awake, no distress   Resp:  Normal effort  MSK:   Moves extremities without difficulty  Other:  Right leg swelling  Medical Decision Making  Medically screening exam initiated at 3:01 PM.  Appropriate orders placed.  Roderic Lammert was informed that the remainder of the evaluation will be completed by another provider, this initial triage assessment does not replace that evaluation, and the importance of remaining in the ED until their evaluation is complete.     Smitty Knudsen, PA-C 11/19/23 1503

## 2023-11-20 ENCOUNTER — Inpatient Hospital Stay (HOSPITAL_COMMUNITY)

## 2023-11-20 DIAGNOSIS — N3090 Cystitis, unspecified without hematuria: Secondary | ICD-10-CM | POA: Diagnosis not present

## 2023-11-20 DIAGNOSIS — I82890 Acute embolism and thrombosis of other specified veins: Secondary | ICD-10-CM | POA: Diagnosis not present

## 2023-11-20 DIAGNOSIS — I2699 Other pulmonary embolism without acute cor pulmonale: Secondary | ICD-10-CM | POA: Diagnosis not present

## 2023-11-20 DIAGNOSIS — I2602 Saddle embolus of pulmonary artery with acute cor pulmonale: Secondary | ICD-10-CM | POA: Diagnosis not present

## 2023-11-20 DIAGNOSIS — N452 Orchitis: Secondary | ICD-10-CM | POA: Diagnosis not present

## 2023-11-20 DIAGNOSIS — B20 Human immunodeficiency virus [HIV] disease: Secondary | ICD-10-CM

## 2023-11-20 LAB — T-HELPER CELLS (CD4) COUNT (NOT AT ARMC)
CD4 % Helper T Cell: 40 % (ref 33–65)
CD4 T Cell Abs: 593 /uL (ref 400–1790)

## 2023-11-20 LAB — GC/CHLAMYDIA PROBE AMP (~~LOC~~) NOT AT ARMC
Chlamydia: NEGATIVE
Comment: NEGATIVE
Comment: NORMAL
Neisseria Gonorrhea: NEGATIVE

## 2023-11-20 LAB — PROTIME-INR
INR: 1.2 (ref 0.8–1.2)
Prothrombin Time: 15.4 s — ABNORMAL HIGH (ref 11.4–15.2)

## 2023-11-20 LAB — CBC
HCT: 35.9 % — ABNORMAL LOW (ref 39.0–52.0)
Hemoglobin: 12.1 g/dL — ABNORMAL LOW (ref 13.0–17.0)
MCH: 30 pg (ref 26.0–34.0)
MCHC: 33.7 g/dL (ref 30.0–36.0)
MCV: 89.1 fL (ref 80.0–100.0)
Platelets: 194 10*3/uL (ref 150–400)
RBC: 4.03 MIL/uL — ABNORMAL LOW (ref 4.22–5.81)
RDW: 13.5 % (ref 11.5–15.5)
WBC: 7.6 10*3/uL (ref 4.0–10.5)
nRBC: 0 % (ref 0.0–0.2)

## 2023-11-20 LAB — ECHOCARDIOGRAM COMPLETE
Area-P 1/2: 3.17 cm2
Calc EF: 51.5 %
Height: 74 in
S' Lateral: 4 cm
Single Plane A2C EF: 55.6 %
Single Plane A4C EF: 43.7 %
Weight: 3209.9 [oz_av]

## 2023-11-20 LAB — BASIC METABOLIC PANEL WITH GFR
Anion gap: 9 (ref 5–15)
BUN: 16 mg/dL (ref 8–23)
CO2: 23 mmol/L (ref 22–32)
Calcium: 9.8 mg/dL (ref 8.9–10.3)
Chloride: 100 mmol/L (ref 98–111)
Creatinine, Ser: 1.31 mg/dL — ABNORMAL HIGH (ref 0.61–1.24)
GFR, Estimated: >60 mL/min (ref >60)
Glucose, Bld: 102 mg/dL — ABNORMAL HIGH (ref 70–99)
Potassium: 3.7 mmol/L (ref 3.5–5.1)
Sodium: 132 mmol/L — ABNORMAL LOW (ref 135–145)

## 2023-11-20 LAB — PHOSPHORUS: Phosphorus: 3.6 mg/dL (ref 2.5–4.6)

## 2023-11-20 LAB — APTT: aPTT: 46 s — ABNORMAL HIGH (ref 24–36)

## 2023-11-20 LAB — VITAMIN D 25 HYDROXY (VIT D DEFICIENCY, FRACTURES): Vit D, 25-Hydroxy: 9.38 ng/mL — ABNORMAL LOW (ref 30–100)

## 2023-11-20 LAB — HEPARIN LEVEL (UNFRACTIONATED): Heparin Unfractionated: 0.14 [IU]/mL — ABNORMAL LOW (ref 0.30–0.70)

## 2023-11-20 MED ORDER — APIXABAN 5 MG PO TABS
10.0000 mg | ORAL_TABLET | Freq: Two times a day (BID) | ORAL | Status: DC
  Administered 2023-11-20 – 2023-11-22 (×5): 10 mg via ORAL
  Filled 2023-11-20 (×5): qty 2

## 2023-11-20 MED ORDER — HEPARIN BOLUS VIA INFUSION
2000.0000 [IU] | Freq: Once | INTRAVENOUS | Status: AC
  Administered 2023-11-20: 2000 [IU] via INTRAVENOUS
  Filled 2023-11-20: qty 2000

## 2023-11-20 MED ORDER — BISACODYL 5 MG PO TBEC
10.0000 mg | DELAYED_RELEASE_TABLET | Freq: Once | ORAL | Status: AC
  Administered 2023-11-20: 10 mg via ORAL
  Filled 2023-11-20: qty 2

## 2023-11-20 MED ORDER — APIXABAN 5 MG PO TABS: 5.0000 mg | ORAL_TABLET | Freq: Two times a day (BID) | ORAL | Status: DC

## 2023-11-20 MED ORDER — POLYETHYLENE GLYCOL 3350 17 G PO PACK
34.0000 g | PACK | Freq: Every day | ORAL | Status: DC
  Administered 2023-11-20 – 2023-11-21 (×2): 34 g via ORAL
  Filled 2023-11-20 (×2): qty 2

## 2023-11-20 MED ORDER — SODIUM CHLORIDE 0.9 % IV SOLN
2.0000 g | INTRAVENOUS | Status: DC
  Filled 2023-11-20: qty 20

## 2023-11-20 MED ORDER — SODIUM CHLORIDE 0.9 % IV SOLN: 2.0000 g | INTRAVENOUS | Status: DC

## 2023-11-20 NOTE — Progress Notes (Signed)
  Echocardiogram 2D Echocardiogram has been performed.  Billy [name] 11/20/2023, 9:32 AM

## 2023-11-20 NOTE — Progress Notes (Addendum)
 PROGRESS NOTE    Jaleil Renwick  ZOX:[id number] DOB: 10/16/1960 DOA: 11/19/2023 PCP: Montez Hageman, DO  Subjective: Patient seen and examined.  Patient states that after his prostate surgery, he spent most the time in bed or chair.  He finally started walking again after he got his Foley catheter removed in the urology office.  CTPA showed no PE.  Echo showed no RV dysfunction.  He has some wall motion abnormality that will need cardiology follow-up.  LVEF 50 to 55%.  He has no chest pain.  Is not on any oxygen.  Patient has been seen by urology.  No plans for operative intervention.  Continue antibiotics for his testicular pain/epididymitis.  IV heparin has been stopped.  Changed over to p.o. Eliquis.  He will need a least 6 months of therapy.   Hospital Course: HPI: Kendrix Orman is a 63 y.o. male with medical history significant of BPH LUTS s.p "simple prostatectomy" on 10/25/2023 robot assisted. Umbilical hernia repair.   Since then patinet reports some scrotal pain. Patient has recived outpatient doxycycline for same.   Paitnet returned to ER today due to several new symptoms : patinet apparently had a fever last night and difficulty with urination flow. No abd pain. Paitnet has had persistent minimal hematuria sicne the time of his surgery. No diarrhea, no vomting. Patient also reports new onset cough, sob and chest pain pleuritic since this AM . No expectoration. Chest pain only with cuohging. Sob with couhging. No leg swelling, no rash on skin. He also reports right lower extremity discomfort medial thigh distally.  Significant Events: Admitted 11/19/2023 for acute PE   Significant Labs: WBC 9.3, HgB 14, plt 259 UA, turbid, HgB mod, Nitrite Positive, LE large, WBC >50 Na 135, K 3.8, CO2 of 25, BUN 16, scr 1.65  Significant Imaging Studies: CXR Low volume exam without acute process.  CTPA/CT abd/pelvis Positive for small volume acute bilateral pulmonary emboli.  Slightly elevated RV LV ratio 0.97. 2. Status post prostatectomy. Mild stranding between the bladder and seminal vesicles and  surrounding the seminal vesicles, question postsurgical change versus infection. Slightly thick-walled urinary bladder with perivesical stranding and mucosal enhancement, suspicious for cystitis. Mild distal urothelial thickening and stranding, possible ascending urinary tract infection. No evidence  for rim enhancing abscess at the surgical bed 3. Large bilateral scrotal hydroceles and suspected diffuse scrotal thickening. No soft tissue gas at the scrotum or perineum to suggest necrotic infection. Consider correlation with scrotal ultrasound 4. Aortic atherosclerosis. Scrotal U/S Negative for testicular torsion or mass. 2. Enlarged heterogeneous and hypervascular epididymides bilaterally suggestive of epididymitis. 3. Small bilateral hydroceles containing particulate debris. 4. Diffuse scrotal wall thickening  Antibiotic Therapy: Anti-infectives (From admission, onward)    Start     Dose/Rate Route Frequency Ordered Stop   11/20/23 1700  vancomycin (VANCOREADY) IVPB 1500 mg/300 mL       Placed in "Followed by" Linked Group   1,500 mg 150 mL/hr over 120 Minutes Intravenous Every 24 hours 11/19/23 1618     11/20/23 1000  bictegravir-emtricitabine-tenofovir AF (BIKTARVY) 50-200-25 MG per tablet 1 tablet        1 tablet Oral Daily 11/19/23 2151     11/20/23 0500  cefTRIAXone (ROCEPHIN) 2 g in sodium chloride 0.9 % 100 mL IVPB        2 g 200 mL/hr over 30 Minutes Intravenous  Once 11/19/23 2138 11/20/23 0556   11/19/23 2200  levofloxacin (LEVAQUIN) IVPB 500 mg  500 mg 100 mL/hr over 60 Minutes Intravenous Every 24 hours 11/19/23 2138 11/29/23 2159   11/19/23 1700  vancomycin (VANCOREADY) IVPB 2000 mg/400 mL       Placed in "Followed by" Linked Group   2,000 mg 200 mL/hr over 120 Minutes Intravenous  Once 11/19/23 1618 11/19/23 2033   11/19/23 1630  ceFEPIme  (MAXIPIME) 2 g in sodium chloride 0.9 % 100 mL IVPB  Status:  Discontinued        2 g 200 mL/hr over 30 Minutes Intravenous Every 12 hours 11/19/23 1618 11/19/23 2138       Procedures:   Consultants: urology    Assessment and Plan: * Acute pulmonary embolism (HCC) On admission. Heparin infusion started. I do not see indication for thrombolysis at thsi time. No cor pulmonale. Check echo.  11-20-2023 echo negative for RV dysfunction.  He only has superficial thrombophlebitis on his lower extremity ultrasounds.  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  Stop IV heparin.  Change over to p.o. Eliquis.  Superficial vein thrombosis On admission. On right with  pulm embolism. Started on heaprin infusion - c.w. same. Patient advised to report any new symptoms headche, skin bruising, severe back pain to staff promptly.  11-20-2023 LE U/S negative for DVT. Has superficial thrombophlebitis. But with his acute PE he most likely has acute LE DVT. Change IV heparin to po Eliquis.  Cystitis On admission. See on imaging . Check urine culture. Abx as above.  11-20-2023 change abx to levaquin. Discussed with urology. No operative intervention needed.   Orchitis On admission. Left. S.p vanco+cefepime in the ER. Give one dose of ceftriaxone and rx with 10 days of levoflxacin. GC probe pending for urine. I found the left testicle pretty hard. Look forward to Dr. Margo Aye (urology eval in this regard)  11-20-2023 change abx to levaquin. Discussed with urology. No operative intervention needed.   Hypercalcemia On admission Chronic. Will send work up in AM  11-20-2023 vitamin D level is now. PTH pending  CKD stage 3a, GFR 45-59 ml/min (HCC) - baseline scR 1.3-1.5 11-20-2023 Baseline scr 1.3-1.5  Human immunodeficiency virus (HIV) disease (HCC) 11-20-2023 continue with Biktarvy  DVT prophylaxis:  apixaban (ELIQUIS) tablet 10 mg  apixaban (ELIQUIS) tablet 5 mg     Code Status: Full Code Family  Communication: no family at bedside. Pt is decisional Disposition Plan: return home Reason for continuing need for hospitalization: on IV abx. Change IV heparin to po eliquis. Possible DC tomorrow.  Objective: Vitals:   11/20/23 0210 11/20/23 0610 11/20/23 0958 11/20/23 1405  BP: 102/70 104/68 114/77 129/75  Pulse: 65 66 65 72  Resp: 18 18 18 18   Temp: 98.3 F (36.8 C) 98.4 F (36.9 C) 97.8 F (36.6 C) 98.3 F (36.8 C)  TempSrc: Oral Oral Oral Oral  SpO2: 100% 100% 100% 100%  Weight:      Height:        Intake/Output Summary (Last 24 hours) at 11/20/2023 1507 Last data filed at 11/20/2023 1400 Gross per 24 hour  Intake 1465.77 ml  Output 2350 ml  Net -884.23 ml   Filed Weights   11/19/23 1151  Weight: 91 kg    Examination:  Physical Exam Vitals and nursing note reviewed.  Constitutional:      General: He is not in acute distress.    Appearance: He is not toxic-appearing or diaphoretic.  HENT:     Head: Normocephalic and atraumatic.     Nose: Nose normal.  Eyes:     General: No scleral icterus. Cardiovascular:     Rate and Rhythm: Normal rate and regular rhythm.  Pulmonary:     Effort: Pulmonary effort is normal.     Breath sounds: Normal breath sounds.  Abdominal:     General: Abdomen is flat. Bowel sounds are normal.     Palpations: Abdomen is soft.  Genitourinary:    Comments: Wearing male purewick Skin:    General: Skin is warm and dry.     Capillary Refill: Capillary refill takes less than 2 seconds.  Neurological:     General: No focal deficit present.     Mental Status: He is alert and oriented to person, place, and time.     Data Reviewed: I have personally reviewed following labs and imaging studies  CBC: Recent Labs  Lab 11/19/23 1200 11/20/23 0428  WBC 9.3 7.6  HGB 14.0 12.1*  HCT 40.4 35.9*  MCV 87.8 89.1  PLT 259 194   Basic Metabolic Panel: Recent Labs  Lab 11/19/23 1200 11/20/23 0540  NA 135 132*  K 3.8 3.7  CL 99 100  CO2  25 23  GLUCOSE 123* 102*  BUN 16 16  CREATININE 1.65* 1.31*  CALCIUM 10.5* 9.8  PHOS  --  3.6   GFR: Estimated Creatinine Clearance: 68.8 mL/min (A) (by C-G formula based on SCr of 1.31 mg/dL (H)). Liver Function Tests: Recent Labs  Lab 11/19/23 1200  AST 19  ALT 18  ALKPHOS 75  BILITOT 0.8  PROT 7.8  ALBUMIN 3.6   Recent Labs  Lab 11/19/23 1200  LIPASE 22   Coagulation Profile: Recent Labs  Lab 11/20/23 0428  INR 1.2   BNP (last 3 results) Recent Labs    11/19/23 1700  BNP 54.2   Sepsis Labs: Recent Labs  Lab 11/19/23 1934  LATICACIDVEN 0.8    Recent Results (from the past 240 hours)  C. trachomatis/N. gonorrhoeae RNA     Status: None   Collection Time: 11/11/23  9:43 AM   Specimen: Urine  Result Value Ref Range Status   C. trachomatis RNA, TMA NOT DETECTED NOT DETECTED Final   N. gonorrhoeae RNA, TMA NOT DETECTED NOT DETECTED Final    Comment: The analytical performance characteristics of this assay, when used to test SurePath(TM) specimens have been determined by [employer]. The modifications have not been cleared or approved by the FDA. This assay has been validated pursuant to the CLIA regulations and is used for clinical purposes. . For additional information, please refer to https://education.quest[employer].com/faq/FAQ154 (This link is being provided for information/ educational purposes only.) .   Blood culture (routine x 2)     Status: None (Preliminary result)   Collection Time: 11/19/23  5:00 PM   Specimen: BLOOD  Result Value Ref Range Status   Specimen Description   Final    BLOOD BLOOD LEFT ARM Performed at [facility], [address] [place], [place] [address]    Special Requests   Final    BOTTLES DRAWN AEROBIC AND ANAEROBIC Blood Culture adequate volume Performed at [facility], [address], [place], [place] [address]    Culture   Final    NO GROWTH < 12 HOURS Performed  at [facility], [address], [place], [place] [address]    Report Status PENDING  Incomplete  Blood culture (routine x 2)     Status: None (Preliminary result)   Collection Time: 11/19/23  5:28  PM   Specimen: BLOOD  Result Value Ref Range Status   Specimen Description   Final    BLOOD SITE NOT SPECIFIED Performed at [facility], [address], [place], [place] [address]    Special Requests   Final    BOTTLES DRAWN AEROBIC AND ANAEROBIC Blood Culture adequate volume Performed at [facility], [address] [place], [place] [address]    Culture   Final    NO GROWTH < 12 HOURS Performed at [facility], [address], [place], [place] [address]    Report Status PENDING  Incomplete     Radiology Studies: ECHOCARDIOGRAM COMPLETE Result Date: 11/20/2023    ECHOCARDIOGRAM REPORT   Patient Name:   IMAAD REUSS Date of Exam: 11/20/2023 Medical Rec #:  [id number]           Height:       74.0 in Accession #:    [id number]          Weight:       200.6 lb Date of Birth:  02/18/1961            BSA:          2.176 m Patient Age:    63 years            BP:           104/68 mmHg Patient Gender: M                   HR:           66 bpm. Exam Location:  Inpatient Procedure: 2D Echo, Cardiac Doppler and Color Doppler (Both Spectral and Color            Flow Doppler were utilized during procedure). Indications:    I26.02 Pulmonary embolus  History:        Patient has no prior history of Echocardiogram examinations.                 Signs/Symptoms:Shortness of Breath, Chest Pain and Dyspnea.  Sonographer:    Sheralyn Boatman RDCS Referring Phys: [id number] [facility] GOEL IMPRESSIONS  1. Left ventricular ejection fraction, by estimation, is 50 to 55%. The left ventricle has low normal function. The left ventricle demonstrates regional wall motion abnormalities (see scoring diagram/findings for description). Left ventricular diastolic  parameters are consistent with  Grade I diastolic dysfunction (impaired relaxation). There is mild hypokinesis of the left ventricular, basal-mid inferolateral wall.  2. Right ventricular systolic function is normal. The right ventricular size is normal.  3. The mitral valve is normal in structure. Trivial mitral valve regurgitation. No evidence of mitral stenosis.  4. The aortic valve is tricuspid. Aortic valve regurgitation is not visualized. No aortic stenosis is present.  5. The inferior vena cava is dilated in size with >50% respiratory variability, suggesting right atrial pressure of 8 mmHg.  6. Cannot exclude a small PFO. Comparison(s): No prior Echocardiogram. Conclusion(s)/Recommendation(s): Otherwise normal echocardiogram, with minor abnormalities described in the report. FINDINGS  Left Ventricle: Left ventricular ejection fraction, by estimation, is 50 to 55%. The left ventricle has low normal function. The left ventricle demonstrates regional wall motion abnormalities. Mild hypokinesis of the left ventricular, basal-mid inferolateral wall. The left ventricular internal cavity size was normal in size. There is no left ventricular hypertrophy. Left ventricular diastolic parameters are consistent with Grade I diastolic dysfunction (impaired relaxation). Right Ventricle: The right ventricular size is normal. No increase in right  ventricular wall thickness. Right ventricular systolic function is normal. Left Atrium: Left atrial size was normal in size. Right Atrium: Right atrial size was normal in size. Pericardium: There is no evidence of pericardial effusion. Mitral Valve: Redundant chordae noted. The mitral valve is normal in structure. Trivial mitral valve regurgitation. No evidence of mitral valve stenosis. Tricuspid Valve: The tricuspid valve is normal in structure. Tricuspid valve regurgitation is trivial. No evidence of tricuspid stenosis. Aortic Valve: The aortic valve is tricuspid. Aortic valve regurgitation is not visualized. No  aortic stenosis is present. Pulmonic Valve: The pulmonic valve was not well visualized. Pulmonic valve regurgitation is not visualized. No evidence of pulmonic stenosis. Aorta: The aortic root and ascending aorta are structurally normal, with no evidence of dilitation. Venous: The inferior vena cava is dilated in size with greater than 50% respiratory variability, suggesting right atrial pressure of 8 mmHg. IAS/Shunts: Cannot exclude a small PFO.  LEFT VENTRICLE PLAX 2D LVIDd:         5.20 cm      Diastology LVIDs:         4.00 cm      LV e' medial:    6.09 cm/s LV PW:         1.10 cm      LV E/e' medial:  8.1 LV IVS:        1.20 cm      LV e' lateral:   10.10 cm/s LVOT diam:     2.70 cm      LV E/e' lateral: 4.9 LV SV:         86 LV SV Index:   40 LVOT Area:     5.73 cm  LV Volumes (MOD) LV vol d, MOD A2C: 113.0 ml LV vol d, MOD A4C: 70.2 ml LV vol s, MOD A2C: 50.2 ml LV vol s, MOD A4C: 39.5 ml LV SV MOD A2C:     62.8 ml LV SV MOD A4C:     70.2 ml LV SV MOD BP:      47.3 ml RIGHT VENTRICLE             IVC RV S prime:     18.10 cm/s  IVC diam: 2.20 cm TAPSE (M-mode): 1.9 cm LEFT ATRIUM             Index        RIGHT ATRIUM           Index LA diam:        2.90 cm 1.33 cm/m   RA Area:     11.90 cm LA Vol (A2C):   23.2 ml 10.66 ml/m  RA Volume:   22.70 ml  10.43 ml/m LA Vol (A4C):   46.5 ml 21.37 ml/m LA Biplane Vol: 35.0 ml 16.08 ml/m  AORTIC VALVE LVOT Vmax:   79.20 cm/s LVOT Vmean:  50.100 cm/s LVOT VTI:    0.151 m  AORTA Ao Root diam: 3.50 cm Ao Asc diam:  3.30 cm MITRAL VALVE MV Area (PHT): 3.17 cm    SHUNTS MV Decel Time: 239 msec    Systemic VTI:  0.15 m MV E velocity: 49.40 cm/s  Systemic Diam: 2.70 cm MV A velocity: 44.70 cm/s MV E/A ratio:  1.11 Jodelle Red MD Electronically signed by Jodelle Red MD Signature Date/Time: 11/20/2023/1:22:07 PM    Final    US SCROTUM W/DOPPLER Result Date: 11/19/2023 CLINICAL DATA:  Scrotal pain and swelling EXAM: SCROTAL ULTRASOUND DOPPLER ULTRASOUND  OF THE TESTICLES  TECHNIQUE: Complete ultrasound examination of the testicles, epididymis, and other scrotal structures was performed. Color and spectral Doppler ultrasound were also utilized to evaluate blood flow to the testicles. COMPARISON:  CT 11/19/2023 FINDINGS: Right testicle Measurements: 4.6 x 2.6 x 3 cm. No mass or microlithiasis visualized. Left testicle Measurements: 4 x 1.9 x 2.6 cm. No mass or microlithiasis visualized. Right epididymis: Enlarged heterogeneous and hypervascular. Small cyst measuring 3 mm Left epididymis:  Enlarged heterogeneous and hypervascular. Hydrocele: Small bilateral hydroceles containing particulate debris. Varicocele:  None visualized. Pulsed Doppler interrogation of both testes demonstrates normal low resistance arterial and venous waveforms bilaterally. Diffuse scrotal wall thickening. IMPRESSION: 1. Negative for testicular torsion or mass. 2. Enlarged heterogeneous and hypervascular epididymides bilaterally suggestive of epididymitis. 3. Small bilateral hydroceles containing particulate debris. 4. Diffuse scrotal wall thickening. Electronically Signed   By: Jasmine Pang M.D.   On: 11/19/2023 23:31   CT ABDOMEN PELVIS W CONTRAST Result Date: 11/19/2023 CLINICAL DATA:  High probability PE, recent surgery on prostate EXAM: CT ANGIOGRAPHY CHEST CT ABDOMEN AND PELVIS WITH CONTRAST TECHNIQUE: Multidetector CT imaging of the chest was performed using the standard protocol during bolus administration of intravenous contrast. Multiplanar CT image reconstructions and MIPs were obtained to evaluate the vascular anatomy. Multidetector CT imaging of the abdomen and pelvis was performed using the standard protocol during bolus administration of intravenous contrast. RADIATION DOSE REDUCTION: This exam was performed according to the departmental dose-optimization program which includes automated exposure control, adjustment of the mA and/or kV according to patient size and/or use of  iterative reconstruction technique. CONTRAST:  OMNIPAQUE IOHEXOL 350 MG/ML SOLN COMPARISON:  MRI 11/03/2021 FINDINGS: CTA CHEST FINDINGS Cardiovascular: Satisfactory opacification of the pulmonary arteries to the segmental level. No evidence of pulmonary embolism. Positive for small volume acute bilateral pulmonary emboli. Small volume thrombus within distal left upper pulmonary artery with multiple small segmental and subsegmental left upper lobe pulmonary emboli. Small volume right upper lobe segmental and subsegmental and right lower lobe subsegmental pulmonary emboli. RV LV ratio 0.97. Nonaneurysmal aorta. Normal cardiac size. No pericardial effusion Mediastinum/Nodes: Patent trachea. No thyroid mass. No suspicious lymph nodes. Esophagus within normal limits. Lungs/Pleura: No pleural effusion or pneumothorax. Subsegmental atelectasis in the right upper lobe. Musculoskeletal: No acute osseous abnormality. Review of the MIP images confirms the above findings. CT ABDOMEN and PELVIS FINDINGS Hepatobiliary: Subcentimeter hypodensity in the liver too small to further characterize. No calcified gallstone or biliary dilatation Pancreas: Unremarkable. No pancreatic ductal dilatation or surrounding inflammatory changes. Spleen: Normal in size without focal abnormality. Adrenals/Urinary Tract: Adrenal glands are normal. Fullness of the renal collecting systems without obstructing stone. Mild distal urothelial thickening and stranding. Slightly thick-walled urinary bladder with perivesical stranding and mucosal enhancement. Stomach/Bowel: Stomach is within normal limits. Appendix appears normal. No evidence of bowel wall thickening, distention, or inflammatory changes. Vascular/Lymphatic: Aortic atherosclerosis. No enlarged abdominal or pelvic lymph nodes. Reproductive: Status post prostatectomy. Mild stranding between the bladder and seminal vesicles and surrounding the seminal vesicles. Large bilateral scrotal  hydroceles and suspected diffuse scrotal thickening. No soft tissue gas at the scrotum or perineum to suggest necrotic infection Other: No free air Musculoskeletal: No acute osseous abnormality Review of the MIP images confirms the above findings. IMPRESSION: 1. Positive for small volume acute bilateral pulmonary emboli. Slightly elevated RV LV ratio 0.97. 2. Status post prostatectomy. Mild stranding between the bladder and seminal vesicles and surrounding the seminal vesicles, question postsurgical change versus infection. Slightly thick-walled urinary bladder with perivesical  stranding and mucosal enhancement, suspicious for cystitis. Mild distal urothelial thickening and stranding, possible ascending urinary tract infection. No evidence for rim enhancing abscess at the surgical bed 3. Large bilateral scrotal hydroceles and suspected diffuse scrotal thickening. No soft tissue gas at the scrotum or perineum to suggest necrotic infection. Consider correlation with scrotal ultrasound 4. Aortic atherosclerosis. Critical Value/emergent results were called by telephone at the time of interpretation on 11/19/2023 at 7:06 Pm to provider [facility] , who verbally acknowledged these results. Electronically Signed   By: Jasmine Pang M.D.   On: 11/19/2023 20:09   CT Angio Chest Pulmonary Embolism (PE) W or WO Contrast Result Date: 11/19/2023 CLINICAL DATA:  High probability PE, recent surgery on prostate EXAM: CT ANGIOGRAPHY CHEST CT ABDOMEN AND PELVIS WITH CONTRAST TECHNIQUE: Multidetector CT imaging of the chest was performed using the standard protocol during bolus administration of intravenous contrast. Multiplanar CT image reconstructions and MIPs were obtained to evaluate the vascular anatomy. Multidetector CT imaging of the abdomen and pelvis was performed using the standard protocol during bolus administration of intravenous contrast. RADIATION DOSE REDUCTION: This exam was performed according to the departmental  dose-optimization program which includes automated exposure control, adjustment of the mA and/or kV according to patient size and/or use of iterative reconstruction technique. CONTRAST:  OMNIPAQUE IOHEXOL 350 MG/ML SOLN COMPARISON:  MRI 11/03/2021 FINDINGS: CTA CHEST FINDINGS Cardiovascular: Satisfactory opacification of the pulmonary arteries to the segmental level. No evidence of pulmonary embolism. Positive for small volume acute bilateral pulmonary emboli. Small volume thrombus within distal left upper pulmonary artery with multiple small segmental and subsegmental left upper lobe pulmonary emboli. Small volume right upper lobe segmental and subsegmental and right lower lobe subsegmental pulmonary emboli. RV LV ratio 0.97. Nonaneurysmal aorta. Normal cardiac size. No pericardial effusion Mediastinum/Nodes: Patent trachea. No thyroid mass. No suspicious lymph nodes. Esophagus within normal limits. Lungs/Pleura: No pleural effusion or pneumothorax. Subsegmental atelectasis in the right upper lobe. Musculoskeletal: No acute osseous abnormality. Review of the MIP images confirms the above findings. CT ABDOMEN and PELVIS FINDINGS Hepatobiliary: Subcentimeter hypodensity in the liver too small to further characterize. No calcified gallstone or biliary dilatation Pancreas: Unremarkable. No pancreatic ductal dilatation or surrounding inflammatory changes. Spleen: Normal in size without focal abnormality. Adrenals/Urinary Tract: Adrenal glands are normal. Fullness of the renal collecting systems without obstructing stone. Mild distal urothelial thickening and stranding. Slightly thick-walled urinary bladder with perivesical stranding and mucosal enhancement. Stomach/Bowel: Stomach is within normal limits. Appendix appears normal. No evidence of bowel wall thickening, distention, or inflammatory changes. Vascular/Lymphatic: Aortic atherosclerosis. No enlarged abdominal or pelvic lymph nodes. Reproductive: Status  post prostatectomy. Mild stranding between the bladder and seminal vesicles and surrounding the seminal vesicles. Large bilateral scrotal hydroceles and suspected diffuse scrotal thickening. No soft tissue gas at the scrotum or perineum to suggest necrotic infection Other: No free air Musculoskeletal: No acute osseous abnormality Review of the MIP images confirms the above findings. IMPRESSION: 1. Positive for small volume acute bilateral pulmonary emboli. Slightly elevated RV LV ratio 0.97. 2. Status post prostatectomy. Mild stranding between the bladder and seminal vesicles and surrounding the seminal vesicles, question postsurgical change versus infection. Slightly thick-walled urinary bladder with perivesical stranding and mucosal enhancement, suspicious for cystitis. Mild distal urothelial thickening and stranding, possible ascending urinary tract infection. No evidence for rim enhancing abscess at the surgical bed 3. Large bilateral scrotal hydroceles and suspected diffuse scrotal thickening. No soft tissue gas at the scrotum or perineum to suggest  necrotic infection. Consider correlation with scrotal ultrasound 4. Aortic atherosclerosis. Critical Value/emergent results were called by telephone at the time of interpretation on 11/19/2023 at 7:06 Pm to provider [facility] , who verbally acknowledged these results. Electronically Signed   By: Jasmine Pang M.D.   On: 11/19/2023 20:09   VAS [place] LOWER EXTREMITY VENOUS (DVT) (7a-7p) Result Date: 11/19/2023  Lower Venous DVT Study Patient Name:  KHASIR WOODROME  Date of Exam:   11/19/2023 Medical Rec #: [id number]            Accession #:    [id number] Date of Birth: 1961-01-15             Patient Gender: M Patient Age:   57 years Exam Location:  [facility] Procedure:      VAS [place] LOWER EXTREMITY VENOUS (DVT) Referring Phys: Asher Muir BARRETT --------------------------------------------------------------------------------  Indications: Pain.  Risk Factors:  Recent prostate surgery and hernia repair (10/25/2023). Comparison Study: No previous exams Performing Technologist: Jody Hill RVT, RDMS  Examination Guidelines: A complete evaluation includes B-mode imaging, spectral Doppler, color Doppler, and power Doppler as needed of all accessible portions of each vessel. Bilateral testing is considered an integral part of a complete examination. Limited examinations for reoccurring indications may be performed as noted. The reflux portion of the exam is performed with the patient in reverse Trendelenburg.  +---------+---------------+---------+-----------+---------------+--------------+ RIGHT    CompressibilityPhasicitySpontaneityProperties     Thrombus Aging +---------+---------------+---------+-----------+---------------+--------------+ CFV      Full                                                             +---------+---------------+---------+-----------+---------------+--------------+ SFJ      Full                                                             +---------+---------------+---------+-----------+---------------+--------------+ FV Prox  Full                                                             +---------+---------------+---------+-----------+---------------+--------------+ FV Mid   Full                                                             +---------+---------------+---------+-----------+---------------+--------------+ FV DistalFull                                                             +---------+---------------+---------+-----------+---------------+--------------+ PFV      Full                                                             +---------+---------------+---------+-----------+---------------+--------------+  POP      Full                                                             +---------+---------------+---------+-----------+---------------+--------------+ PTV      Full                                                              +---------+---------------+---------+-----------+---------------+--------------+ PERO                                                       Not well                                                                  visualized     +---------+---------------+---------+-----------+---------------+--------------+ GSV      None           No       No         mid calf to    Acute                                                      ankle                         +---------+---------------+---------+-----------+---------------+--------------+ VV's     None           No       No         Distal medial  Acute                                                      thigh & mid                                                               calf                          +---------+---------------+---------+-----------+---------------+--------------+   +----+---------------+---------+-----------+----------+--------------+ LEFTCompressibilityPhasicitySpontaneityPropertiesThrombus Aging +----+---------------+---------+-----------+----------+--------------+ CFV Full           Yes      Yes                                 +----+---------------+---------+-----------+----------+--------------+  Summary: RIGHT: - Findings consistent with acute superficial vein thrombosis involving the right great saphenous vein, and right varicosities or other superficial veins.  - There is no evidence of deep vein thrombosis in the lower extremity.  - No cystic structure found in the popliteal fossa.  LEFT: - No evidence of common femoral vein obstruction.   *See table(s) above for measurements and observations. Electronically signed by Lemar Livings MD on 11/19/2023 at 7:46:54 PM.    Final    DG Chest 2 View Result Date: 11/19/2023 CLINICAL DATA:  Shortness of breath and cough, recent prostate surgery 1 week ago, fever last night EXAM:  CHEST - 2 VIEW COMPARISON:  04/06/2019 FINDINGS: The heart size and mediastinal contours are within normal limits. Similar decreased lung volumes. Both lungs are clear. Negative for edema, effusion or pneumothorax. Trachea midline. The visualized skeletal structures are unremarkable. IMPRESSION: Low volume exam without acute process. Electronically Signed   By: Judie Petit.  Shick M.D.   On: 11/19/2023 16:31    Scheduled Meds:  apixaban  10 mg Oral BID   Followed by   Melene Muller ON 11/27/2023] apixaban  5 mg Oral BID   bictegravir-emtricitabine-tenofovir AF  1 tablet Oral Daily   bisacodyl  10 mg Oral Once   polyethylene glycol  34 g Oral Daily   sodium chloride flush  3 mL Intravenous Q12H   Continuous Infusions:  levofloxacin (LEVAQUIN) IV 500 mg (11/19/23 2228)     LOS: 1 day   Time spent: 40 minutes  Carollee Herter, DO  [facility]  11/20/2023, 3:07 PM

## 2023-11-20 NOTE — Progress Notes (Signed)
 PHARMACY - ANTICOAGULATION CONSULT NOTE  Pharmacy Consult for heparin Indication: pulmonary embolus  No Known Allergies  Patient Measurements: Height: 6\' 2"  (188 cm) Weight: 91 kg (200 lb 9.9 oz) IBW/kg (Calculated) : 82.2 HEPARIN DW (KG): 91  Vital Signs: Temp: 98.3 F (36.8 C) (04/09 0210) Temp Source: Oral (04/09 0210) BP: 102/70 (04/09 0210) Pulse Rate: 65 (04/09 0210)  Labs: Recent Labs    11/19/23 1200 11/19/23 1700 11/19/23 2250 11/20/23 0428  HGB 14.0  --   --  12.1*  HCT 40.4  --   --  35.9*  PLT 259  --   --  194  APTT  --   --   --  46*  LABPROT  --   --   --  15.4*  INR  --   --   --  1.2  HEPARINUNFRC  --   --   --  0.14*  CREATININE 1.65*  --   --   --   TROPONINIHS  --  6 7  --     Estimated Creatinine Clearance: 54.7 mL/min (A) (by C-G formula based on SCr of 1.65 mg/dL (H)).   Medical History: Past Medical History:  Diagnosis Date   Headache    few times a month   HIV (human immunodeficiency virus infection) (HCC)    Assessment: 63 YO male presenting with worsening symptoms of scrotal cellulitis, right leg swelling, chest pain and shortness of breath. Doppler showing acute superficial vein thrombosis. CTA PE positive for PE. Patient is not on anticoagulation PTA. Pharmacy has been consulted for heparin dosing.   Today, 11/20/23: Initial heparin level 0.14- subtherapeutic on IV heparin 1600 units/hr CBC: Hgb 12.1-slightly low, pltc WNL No bleeding or infusion related issues reported by RN  Goal of Therapy:  Heparin level 0.3-0.7 units/ml Monitor platelets by anticoagulation protocol: Yes   Plan:  Give heparin 2000 units IV x1  Increase heparin gtt to 1800 units/hr  Check heparin level 8hrs after rate change Monitor heparin level, CBC, and s/sx of bleeding daily  Junita Push, PharmD, BCPS 4/9/20255:14 AM

## 2023-11-20 NOTE — Plan of Care (Signed)
  Problem: Education: Goal: Knowledge of the procedure and recovery process will improve Outcome: Progressing   

## 2023-11-20 NOTE — Assessment & Plan Note (Deleted)
 11-20-2023 echo negative for RV dysfunction.  He only has superficial thrombophlebitis on his lower extremity ultrasounds.  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  Stop IV heparin.  Change over to p.o. Eliquis.

## 2023-11-20 NOTE — Assessment & Plan Note (Signed)
 11-20-2023 continue with [facility]  11-21-2023 continue Biktarvy.  CD4 count is normal at 593.  11-22-2023 Continue Biktarvy at home.

## 2023-11-20 NOTE — TOC Initial Note (Signed)
 Transition of Care [facility]) - Initial/Assessment Note    Patient Details  Name: Billy [name] MRN: [id number] Date of Birth: 08-Feb-1961  [facility] CM/SW Contact:    Howell Rucks, RN Phone Number: 11/20/2023, 10:33 AM  Clinical Narrative:  Met with pt at bedside to introduce role of TOC/NCM and review for dc planning, pt reports he has an established PCP an pharmacy, no current home care services or home DME, reports he feels safe returning home with support from spouse/family. TOC will continue to follow.                  Expected Discharge Plan: Home/Self Care Barriers to Discharge: Continued Medical Work up   Patient Goals and CMS Choice Patient states their goals for this hospitalization and ongoing recovery are:: return home          Expected Discharge Plan and Services       Living arrangements for the past 2 months: Single Family Home                                      Prior Living Arrangements/Services Living arrangements for the past 2 months: Single Family Home Lives with:: Spouse Patient language and need for interpreter reviewed:: Yes Do you feel safe going back to the place where you live?: Yes      Need for Family Participation in Patient Care: Yes (Comment) Care giver support system in place?: Yes (comment)   Criminal Activity/Legal Involvement Pertinent to Current Situation/Hospitalization: No - Comment as needed  Activities of Daily Living   ADL Screening (condition at time of admission) Independently performs ADLs?: Yes (appropriate for developmental age) Is the patient deaf or have difficulty hearing?: No Does the patient have difficulty seeing, even when wearing glasses/contacts?: No Does the patient have difficulty concentrating, remembering, or making decisions?: No  Permission Sought/Granted                  Emotional Assessment Appearance:: Appears stated age Attitude/Demeanor/Rapport: Gracious Affect  (typically observed): Accepting Orientation: : Oriented to Self, Oriented to Place, Oriented to  Time, Oriented to Situation Alcohol / Substance Use: Not Applicable Psych Involvement: No (comment)  Admission diagnosis:  Cellulitis of scrotum [N49.2] Pulmonary embolism (HCC) [I26.99] Acute pulmonary embolism, unspecified pulmonary embolism type, unspecified whether acute cor pulmonale present (HCC) [I26.99] Patient Active Problem List   Diagnosis Date Noted   CKD (chronic kidney disease) stage 3, GFR 30-59 ml/min (HCC) 11/19/2023   Hypercalcemia 11/19/2023   Orchitis 11/19/2023   Cystitis 11/19/2023   Superficial vein thrombosis 11/19/2023   Acute pulmonary embolism (HCC) 11/19/2023   Pulmonary embolism (HCC) 11/19/2023   BPH with obstruction/lower urinary tract symptoms 10/25/2023   BPH (benign prostatic hyperplasia) 03/22/2022   Venous insufficiency of both lower extremities 02/28/2021   Hypogonadism male 11/13/2012   HEMATOCHEZIA 12/27/2009   HYPERTENSION NEC 12/27/2009   ERECTILE DYSFUNCTION, 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 [place] [address]  Phone: [phone] Fax: [phone]  [id number] [place], [place] - [address] [facility] [address] [place] [address] Phone: [phone] Fax: [phone]     Social Drivers of Health (SDOH) Social History: SDOH Screenings   Food Insecurity: No Food Insecurity (11/19/2023)  Housing: Low Risk  (11/19/2023)  Transportation  Needs: No Transportation Needs (11/19/2023)  Utilities: Not At Risk (11/19/2023)  Depression (PHQ2-9): Low Risk  (11/11/2023)  Tobacco Use: Low Risk  (11/19/2023)   SDOH Interventions:     Readmission Risk Interventions    11/20/2023   10:31 AM  Readmission Risk Prevention Plan  Post Dischage Appt Complete  Medication Screening Complete  Transportation Screening Complete

## 2023-11-20 NOTE — Hospital Course (Signed)
 HPI: Billy [name] is a 63 y.o. male with medical history significant of BPH LUTS s.p "simple prostatectomy" on 10/25/2023 robot assisted. Umbilical hernia repair.   Since then patinet reports some scrotal pain. Patient has recived outpatient doxycycline for same.   Paitnet returned to ER today due to several new symptoms : patinet apparently had a fever last night and difficulty with urination flow. No abd pain. Paitnet has had persistent minimal hematuria sicne the time of his surgery. No diarrhea, no vomting. Patient also reports new onset cough, sob and chest pain pleuritic since this AM . No expectoration. Chest pain only with cuohging. Sob with couhging. No leg swelling, no rash on skin. He also reports right lower extremity discomfort medial thigh distally.  Significant Events: Admitted 11/19/2023 for acute PE   Significant Labs: WBC 9.3, HgB 14, plt 259 UA, turbid, HgB mod, Nitrite Positive, LE large, WBC >50 Na 135, K 3.8, CO2 of 25, BUN 16, scr 1.65  Significant Imaging Studies: CXR Low volume exam without acute process.  CTPA/CT abd/pelvis Positive for small volume acute bilateral pulmonary emboli. Slightly elevated RV LV ratio 0.97. 2. Status post prostatectomy. Mild stranding between the bladder and seminal vesicles and  surrounding the seminal vesicles, question postsurgical change versus infection. Slightly thick-walled urinary bladder with perivesical stranding and mucosal enhancement, suspicious for cystitis. Mild distal urothelial thickening and stranding, possible ascending urinary tract infection. No evidence  for rim enhancing abscess at the surgical bed 3. Large bilateral scrotal hydroceles and suspected diffuse scrotal thickening. No soft tissue gas at the scrotum or perineum to suggest necrotic infection. Consider correlation with scrotal ultrasound 4. Aortic atherosclerosis. Scrotal U/S Negative for testicular torsion or mass. 2. Enlarged heterogeneous and hypervascular  epididymides bilaterally suggestive of epididymitis. 3. Small bilateral hydroceles containing particulate debris. 4. Diffuse scrotal wall thickening  Antibiotic Therapy: Anti-infectives (From admission, onward)    Start     Dose/Rate Route Frequency Ordered Stop   11/20/23 1700  vancomycin (VANCOREADY) IVPB 1500 mg/300 mL       Placed in "Followed by" Linked Group   1,500 mg 150 mL/hr over 120 Minutes Intravenous Every 24 hours 11/19/23 1618     11/20/23 1000  bictegravir-emtricitabine-tenofovir AF (BIKTARVY) 50-200-25 MG per tablet 1 tablet        1 tablet Oral Daily 11/19/23 2151     11/20/23 0500  cefTRIAXone (ROCEPHIN) 2 g in sodium chloride 0.9 % 100 mL IVPB        2 g 200 mL/hr over 30 Minutes Intravenous  Once 11/19/23 2138 11/20/23 0556   11/19/23 2200  levofloxacin (LEVAQUIN) IVPB 500 mg        500 mg 100 mL/hr over 60 Minutes Intravenous Every 24 hours 11/19/23 2138 11/29/23 2159   11/19/23 1700  vancomycin (VANCOREADY) IVPB 2000 mg/400 mL       Placed in "Followed by" Linked Group   2,000 mg 200 mL/hr over 120 Minutes Intravenous  Once 11/19/23 1618 11/19/23 2033   11/19/23 1630  ceFEPIme (MAXIPIME) 2 g in sodium chloride 0.9 % 100 mL IVPB  Status:  Discontinued        2 g 200 mL/hr over 30 Minutes Intravenous Every 12 hours 11/19/23 1618 11/19/23 2138       Procedures:   Consultants: urology

## 2023-11-20 NOTE — Consult Note (Signed)
 Urology Consult   Physician requesting consult: Dr. Maryjean Ka  Reason for consult:  epididymitis/cystitis  History of Present Illness: Billy [name] is a 63 y.o. s/p robotic simple prostatectomy 10/25/2023 (Dr. Berneice Heinrich).  Patient presented to the emergency department last night with complaints of fever worsening testicular pain right lower extremity discomfort as well as pleuritic chest pain.  Further evaluation with imaging has demonstrated bilateral PE as well as imaging findings consistent with bilateral epididymal orchitis and cystitis.  Urinalysis is concerning for infection.  He had no leukocytosis.  Patient noted to have slight bump in creatinine to 1.65.  Patient reports that he is voiding without difficulty.  He denies a history of voiding or storage urinary symptoms, hematuria, UTIs, STDs, urolithiasis, GU malignancy/trauma/surgery.  Past Medical History:  Diagnosis Date   Headache    few times a month   HIV (human immunodeficiency virus infection) (HCC)     Past Surgical History:  Procedure Laterality Date   EYE SURGERY Right    cosmetic eye   XI ROBOTIC ASSISTED SIMPLE PROSTATECTOMY N/A 10/25/2023   Procedure: PROSTATECTOMY, SIMPLE, ROBOT-ASSISTED;  Surgeon: Loletta Parish., MD;  Location: [facility];  Service: Urology;  Laterality: N/A;  180 MINUTES NEEDED FOR CASE    Medications:  Home meds:    Scheduled Meds:  bictegravir-emtricitabine-tenofovir AF  1 tablet Oral Daily   sodium chloride flush  3 mL Intravenous Q12H   Continuous Infusions:  cefTRIAXone (ROCEPHIN)  IV     heparin 1,800 Units/hr (11/20/23 0620)   levofloxacin (LEVAQUIN) IV 500 mg (11/19/23 2228)   vancomycin     PRN Meds:.acetaminophen **OR** acetaminophen, fentaNYL (SUBLIMAZE) injection, HYDROcodone-acetaminophen, polyethylene glycol  Allergies: No Known Allergies  History reviewed. No pertinent family history.  Social History:  reports that he has never smoked. He has never used smokeless  tobacco. He reports that he does not drink alcohol and does not use drugs.  ROS: A complete review of systems was performed.  All systems are negative except for pertinent findings as noted.  Physical Exam:  Vital signs in last 24 hours: Temp:  [98 F (36.7 C)-101.2 F (38.4 C)] 98.4 F (36.9 C) (04/09 0610) Pulse Rate:  [65-100] 66 (04/09 0610) Resp:  [17-23] 18 (04/09 0610) BP: (102-133)/(68-90) 104/68 (04/09 0610) SpO2:  [98 %-100 %] 100 % (04/09 0610) Weight:  [91 kg] 91 kg (04/08 1151) Constitutional:  Alert and oriented, No acute distress ABD: soft NT/NT GU:  nl plallus.  Mild scrotal swelling with skin thickening and tender enlarged/firm testes bilaterally c/w epididymorchitis.  Laboratory Data:  Recent Labs    11/19/23 1200 11/20/23 0428  WBC 9.3 7.6  HGB 14.0 12.1*  HCT 40.4 35.9*  PLT 259 194    Recent Labs    11/19/23 1200 11/20/23 0540  NA 135 132*  K 3.8 3.7  CL 99 100  GLUCOSE 123* 102*  BUN 16 16  CALCIUM 10.5* 9.8  CREATININE 1.65* 1.31*     Results for orders placed or performed during the hospital encounter of 11/19/23 (from the past 24 hours)  Lipase, blood     Status: None   Collection Time: 11/19/23 12:00 PM  Result Value Ref Range   Lipase 22 11 - 51 U/L  Comprehensive metabolic panel     Status: Abnormal   Collection Time: 11/19/23 12:00 PM  Result Value Ref Range   Sodium 135 135 - 145 mmol/L   Potassium 3.8 3.5 - 5.1 mmol/L   Chloride 99 98 -  111 mmol/L   CO2 25 22 - 32 mmol/L   Glucose, Bld 123 (H) 70 - 99 mg/dL   BUN 16 8 - 23 mg/dL   Creatinine, Ser 1.61 (H) 0.61 - 1.24 mg/dL   Calcium 09.6 (H) 8.9 - 10.3 mg/dL   Total Protein 7.8 6.5 - 8.1 g/dL   Albumin 3.6 3.5 - 5.0 g/dL   AST 19 15 - 41 U/L   ALT 18 0 - 44 U/L   Alkaline Phosphatase 75 38 - 126 U/L   Total Bilirubin 0.8 0.0 - 1.2 mg/dL   GFR, Estimated 47 (L) >60 mL/min   Anion gap 11 5 - 15  CBC     Status: None   Collection Time: 11/19/23 12:00 PM  Result Value  Ref Range   WBC 9.3 4.0 - 10.5 K/uL   RBC 4.60 4.22 - 5.81 MIL/uL   Hemoglobin 14.0 13.0 - 17.0 g/dL   HCT 04.5 40.9 - 81.1 %   MCV 87.8 80.0 - 100.0 fL   MCH 30.4 26.0 - 34.0 pg   MCHC 34.7 30.0 - 36.0 g/dL   RDW 91.4 78.2 - 95.6 %   Platelets 259 150 - 400 K/uL   nRBC 0.0 0.0 - 0.2 %  Urinalysis, Routine w reflex microscopic -Urine, Clean Catch     Status: Abnormal   Collection Time: 11/19/23 12:00 PM  Result Value Ref Range   Color, Urine AMBER (A) YELLOW   APPearance TURBID (A) CLEAR   Specific Gravity, Urine 1.013 1.005 - 1.030   pH 5.0 5.0 - 8.0   Glucose, UA NEGATIVE NEGATIVE mg/dL   Hgb urine dipstick MODERATE (A) NEGATIVE   Bilirubin Urine NEGATIVE NEGATIVE   Ketones, ur NEGATIVE NEGATIVE mg/dL   Protein, ur 213 (A) NEGATIVE mg/dL   Nitrite POSITIVE (A) NEGATIVE   Leukocytes,Ua LARGE (A) NEGATIVE   RBC / HPF >50 0 - 5 RBC/hpf   WBC, UA >50 0 - 5 WBC/hpf   Bacteria, UA RARE (A) NONE SEEN   Squamous Epithelial / HPF 0-5 0 - 5 /HPF   WBC Clumps PRESENT    Budding Yeast PRESENT   Troponin I (High Sensitivity)     Status: None   Collection Time: 11/19/23  5:00 PM  Result Value Ref Range   Troponin I (High Sensitivity) 6 <18 ng/L  Brain natriuretic peptide     Status: None   Collection Time: 11/19/23  5:00 PM  Result Value Ref Range   B Natriuretic Peptide 54.2 0.0 - 100.0 pg/mL  Lactic acid, plasma     Status: None   Collection Time: 11/19/23  7:34 PM  Result Value Ref Range   Lactic Acid, Venous 0.8 0.5 - 1.9 mmol/L  Troponin I (High Sensitivity)     Status: None   Collection Time: 11/19/23 10:50 PM  Result Value Ref Range   Troponin I (High Sensitivity) 7 <18 ng/L  CBC     Status: Abnormal   Collection Time: 11/20/23  4:28 AM  Result Value Ref Range   WBC 7.6 4.0 - 10.5 K/uL   RBC 4.03 (L) 4.22 - 5.81 MIL/uL   Hemoglobin 12.1 (L) 13.0 - 17.0 g/dL   HCT 08.6 (L) 57.8 - 46.9 %   MCV 89.1 80.0 - 100.0 fL   MCH 30.0 26.0 - 34.0 pg   MCHC 33.7 30.0 - 36.0 g/dL    RDW 62.9 52.8 - 41.3 %   Platelets 194 150 - 400 K/uL   nRBC  0.0 0.0 - 0.2 %  Heparin level (unfractionated)     Status: Abnormal   Collection Time: 11/20/23  4:28 AM  Result Value Ref Range   Heparin Unfractionated 0.14 (L) 0.30 - 0.70 IU/mL  APTT     Status: Abnormal   Collection Time: 11/20/23  4:28 AM  Result Value Ref Range   aPTT 46 (H) 24 - 36 seconds  Protime-INR     Status: Abnormal   Collection Time: 11/20/23  4:28 AM  Result Value Ref Range   Prothrombin Time 15.4 (H) 11.4 - 15.2 seconds   INR 1.2 0.8 - 1.2  Basic metabolic panel with GFR     Status: Abnormal   Collection Time: 11/20/23  5:40 AM  Result Value Ref Range   Sodium 132 (L) 135 - 145 mmol/L   Potassium 3.7 3.5 - 5.1 mmol/L   Chloride 100 98 - 111 mmol/L   CO2 23 22 - 32 mmol/L   Glucose, Bld 102 (H) 70 - 99 mg/dL   BUN 16 8 - 23 mg/dL   Creatinine, Ser 9.56 (H) 0.61 - 1.24 mg/dL   Calcium 9.8 8.9 - 38.7 mg/dL   GFR, Estimated >56 >43 mL/min   Anion gap 9 5 - 15  Phosphorus     Status: None   Collection Time: 11/20/23  5:40 AM  Result Value Ref Range   Phosphorus 3.6 2.5 - 4.6 mg/dL   Recent Results (from the past 240 hours)  C. trachomatis/N. gonorrhoeae RNA     Status: None   Collection Time: 11/11/23  9:43 AM   Specimen: Urine  Result Value Ref Range Status   C. trachomatis RNA, TMA NOT DETECTED NOT DETECTED Final   N. gonorrhoeae RNA, TMA NOT DETECTED NOT DETECTED Final    Comment: The analytical performance characteristics of this assay, when used to test SurePath(TM) specimens have been determined by [employer]. The modifications have not been cleared or approved by the FDA. This assay has been validated pursuant to the CLIA regulations and is used for clinical purposes. . For additional information, please refer to https://education.[employer].com/faq/FAQ154 (This link is being provided for information/ educational purposes only.) .     Renal Function: Recent Labs     11/19/23 1200 11/20/23 0540  CREATININE 1.65* 1.31*   Estimated Creatinine Clearance: 68.8 mL/min (A) (by C-G formula based on SCr of 1.31 mg/dL (H)).  Radiologic Imaging: US SCROTUM W/DOPPLER Result Date: 11/19/2023 CLINICAL DATA:  Scrotal pain and swelling EXAM: SCROTAL ULTRASOUND DOPPLER ULTRASOUND OF THE TESTICLES TECHNIQUE: Complete ultrasound examination of the testicles, epididymis, and other scrotal structures was performed. Color and spectral Doppler ultrasound were also utilized to evaluate blood flow to the testicles. COMPARISON:  CT 11/19/2023 FINDINGS: Right testicle Measurements: 4.6 x 2.6 x 3 cm. No mass or microlithiasis visualized. Left testicle Measurements: 4 x 1.9 x 2.6 cm. No mass or microlithiasis visualized. Right epididymis: Enlarged heterogeneous and hypervascular. Small cyst measuring 3 mm Left epididymis:  Enlarged heterogeneous and hypervascular. Hydrocele: Small bilateral hydroceles containing particulate debris. Varicocele:  None visualized. Pulsed Doppler interrogation of both testes demonstrates normal low resistance arterial and venous waveforms bilaterally. Diffuse scrotal wall thickening. IMPRESSION: 1. Negative for testicular torsion or mass. 2. Enlarged heterogeneous and hypervascular epididymides bilaterally suggestive of epididymitis. 3. Small bilateral hydroceles containing particulate debris. 4. Diffuse scrotal wall thickening. Electronically Signed   By: Jasmine Pang M.D.   On: 11/19/2023 23:31   CT ABDOMEN PELVIS W CONTRAST Result Date: 11/19/2023 CLINICAL  DATA:  High probability PE, recent surgery on prostate EXAM: CT ANGIOGRAPHY CHEST CT ABDOMEN AND PELVIS WITH CONTRAST TECHNIQUE: Multidetector CT imaging of the chest was performed using the standard protocol during bolus administration of intravenous contrast. Multiplanar CT image reconstructions and MIPs were obtained to evaluate the vascular anatomy. Multidetector CT imaging of the abdomen and pelvis was  performed using the standard protocol during bolus administration of intravenous contrast. RADIATION DOSE REDUCTION: This exam was performed according to the departmental dose-optimization program which includes automated exposure control, adjustment of the mA and/or kV according to patient size and/or use of iterative reconstruction technique. CONTRAST:  OMNIPAQUE IOHEXOL 350 MG/ML SOLN COMPARISON:  MRI 11/03/2021 FINDINGS: CTA CHEST FINDINGS Cardiovascular: Satisfactory opacification of the pulmonary arteries to the segmental level. No evidence of pulmonary embolism. Positive for small volume acute bilateral pulmonary emboli. Small volume thrombus within distal left upper pulmonary artery with multiple small segmental and subsegmental left upper lobe pulmonary emboli. Small volume right upper lobe segmental and subsegmental and right lower lobe subsegmental pulmonary emboli. RV LV ratio 0.97. Nonaneurysmal aorta. Normal cardiac size. No pericardial effusion Mediastinum/Nodes: Patent trachea. No thyroid mass. No suspicious lymph nodes. Esophagus within normal limits. Lungs/Pleura: No pleural effusion or pneumothorax. Subsegmental atelectasis in the right upper lobe. Musculoskeletal: No acute osseous abnormality. Review of the MIP images confirms the above findings. CT ABDOMEN and PELVIS FINDINGS Hepatobiliary: Subcentimeter hypodensity in the liver too small to further characterize. No calcified gallstone or biliary dilatation Pancreas: Unremarkable. No pancreatic ductal dilatation or surrounding inflammatory changes. Spleen: Normal in size without focal abnormality. Adrenals/Urinary Tract: Adrenal glands are normal. Fullness of the renal collecting systems without obstructing stone. Mild distal urothelial thickening and stranding. Slightly thick-walled urinary bladder with perivesical stranding and mucosal enhancement. Stomach/Bowel: Stomach is within normal limits. Appendix appears normal. No evidence of  bowel wall thickening, distention, or inflammatory changes. Vascular/Lymphatic: Aortic atherosclerosis. No enlarged abdominal or pelvic lymph nodes. Reproductive: Status post prostatectomy. Mild stranding between the bladder and seminal vesicles and surrounding the seminal vesicles. Large bilateral scrotal hydroceles and suspected diffuse scrotal thickening. No soft tissue gas at the scrotum or perineum to suggest necrotic infection Other: No free air Musculoskeletal: No acute osseous abnormality Review of the MIP images confirms the above findings. IMPRESSION: 1. Positive for small volume acute bilateral pulmonary emboli. Slightly elevated RV LV ratio 0.97. 2. Status post prostatectomy. Mild stranding between the bladder and seminal vesicles and surrounding the seminal vesicles, question postsurgical change versus infection. Slightly thick-walled urinary bladder with perivesical stranding and mucosal enhancement, suspicious for cystitis. Mild distal urothelial thickening and stranding, possible ascending urinary tract infection. No evidence for rim enhancing abscess at the surgical bed 3. Large bilateral scrotal hydroceles and suspected diffuse scrotal thickening. No soft tissue gas at the scrotum or perineum to suggest necrotic infection. Consider correlation with scrotal ultrasound 4. Aortic atherosclerosis. Critical Value/emergent results were called by telephone at the time of interpretation on 11/19/2023 at 7:06 Pm to provider [facility] , who verbally acknowledged these results. Electronically Signed   By: Jasmine Pang M.D.   On: 11/19/2023 20:09   CT Angio Chest Pulmonary Embolism (PE) W or WO Contrast Result Date: 11/19/2023 CLINICAL DATA:  High probability PE, recent surgery on prostate EXAM: CT ANGIOGRAPHY CHEST CT ABDOMEN AND PELVIS WITH CONTRAST TECHNIQUE: Multidetector CT imaging of the chest was performed using the standard protocol during bolus administration of intravenous contrast. Multiplanar  CT image reconstructions and MIPs were obtained to evaluate  the vascular anatomy. Multidetector CT imaging of the abdomen and pelvis was performed using the standard protocol during bolus administration of intravenous contrast. RADIATION DOSE REDUCTION: This exam was performed according to the departmental dose-optimization program which includes automated exposure control, adjustment of the mA and/or kV according to patient size and/or use of iterative reconstruction technique. CONTRAST:  OMNIPAQUE IOHEXOL 350 MG/ML SOLN COMPARISON:  MRI 11/03/2021 FINDINGS: CTA CHEST FINDINGS Cardiovascular: Satisfactory opacification of the pulmonary arteries to the segmental level. No evidence of pulmonary embolism. Positive for small volume acute bilateral pulmonary emboli. Small volume thrombus within distal left upper pulmonary artery with multiple small segmental and subsegmental left upper lobe pulmonary emboli. Small volume right upper lobe segmental and subsegmental and right lower lobe subsegmental pulmonary emboli. RV LV ratio 0.97. Nonaneurysmal aorta. Normal cardiac size. No pericardial effusion Mediastinum/Nodes: Patent trachea. No thyroid mass. No suspicious lymph nodes. Esophagus within normal limits. Lungs/Pleura: No pleural effusion or pneumothorax. Subsegmental atelectasis in the right upper lobe. Musculoskeletal: No acute osseous abnormality. Review of the MIP images confirms the above findings. CT ABDOMEN and PELVIS FINDINGS Hepatobiliary: Subcentimeter hypodensity in the liver too small to further characterize. No calcified gallstone or biliary dilatation Pancreas: Unremarkable. No pancreatic ductal dilatation or surrounding inflammatory changes. Spleen: Normal in size without focal abnormality. Adrenals/Urinary Tract: Adrenal glands are normal. Fullness of the renal collecting systems without obstructing stone. Mild distal urothelial thickening and stranding. Slightly thick-walled urinary bladder with  perivesical stranding and mucosal enhancement. Stomach/Bowel: Stomach is within normal limits. Appendix appears normal. No evidence of bowel wall thickening, distention, or inflammatory changes. Vascular/Lymphatic: Aortic atherosclerosis. No enlarged abdominal or pelvic lymph nodes. Reproductive: Status post prostatectomy. Mild stranding between the bladder and seminal vesicles and surrounding the seminal vesicles. Large bilateral scrotal hydroceles and suspected diffuse scrotal thickening. No soft tissue gas at the scrotum or perineum to suggest necrotic infection Other: No free air Musculoskeletal: No acute osseous abnormality Review of the MIP images confirms the above findings. IMPRESSION: 1. Positive for small volume acute bilateral pulmonary emboli. Slightly elevated RV LV ratio 0.97. 2. Status post prostatectomy. Mild stranding between the bladder and seminal vesicles and surrounding the seminal vesicles, question postsurgical change versus infection. Slightly thick-walled urinary bladder with perivesical stranding and mucosal enhancement, suspicious for cystitis. Mild distal urothelial thickening and stranding, possible ascending urinary tract infection. No evidence for rim enhancing abscess at the surgical bed 3. Large bilateral scrotal hydroceles and suspected diffuse scrotal thickening. No soft tissue gas at the scrotum or perineum to suggest necrotic infection. Consider correlation with scrotal ultrasound 4. Aortic atherosclerosis. Critical Value/emergent results were called by telephone at the time of interpretation on 11/19/2023 at 7:06 Pm to provider [facility] , who verbally acknowledged these results. Electronically Signed   By: Jasmine Pang M.D.   On: 11/19/2023 20:09   VAS [place] LOWER EXTREMITY VENOUS (DVT) (7a-7p) Result Date: 11/19/2023  Lower Venous DVT Study Patient Name:  LORAS GRIESHOP  Date of Exam:   11/19/2023 Medical Rec #: [id number]            Accession #:    [id number] Date of  Birth: 1961/07/31             Patient Gender: M Patient Age:   44 years Exam Location:  [facility] Procedure:      VAS [place] LOWER EXTREMITY VENOUS (DVT) Referring Phys: Asher Muir BARRETT --------------------------------------------------------------------------------  Indications: Pain.  Risk Factors: Recent prostate surgery and hernia repair (10/25/2023). Comparison Study:  No previous exams Performing Technologist: Jody Hill RVT, RDMS  Examination Guidelines: A complete evaluation includes B-mode imaging, spectral Doppler, color Doppler, and power Doppler as needed of all accessible portions of each vessel. Bilateral testing is considered an integral part of a complete examination. Limited examinations for reoccurring indications may be performed as noted. The reflux portion of the exam is performed with the patient in reverse Trendelenburg.  +---------+---------------+---------+-----------+---------------+--------------+ RIGHT    CompressibilityPhasicitySpontaneityProperties     Thrombus Aging +---------+---------------+---------+-----------+---------------+--------------+ CFV      Full                                                             +---------+---------------+---------+-----------+---------------+--------------+ SFJ      Full                                                             +---------+---------------+---------+-----------+---------------+--------------+ FV Prox  Full                                                             +---------+---------------+---------+-----------+---------------+--------------+ FV Mid   Full                                                             +---------+---------------+---------+-----------+---------------+--------------+ FV DistalFull                                                             +---------+---------------+---------+-----------+---------------+--------------+ PFV      Full                                                              +---------+---------------+---------+-----------+---------------+--------------+ POP      Full                                                             +---------+---------------+---------+-----------+---------------+--------------+ PTV      Full                                                             +---------+---------------+---------+-----------+---------------+--------------+  PERO                                                       Not well                                                                  visualized     +---------+---------------+---------+-----------+---------------+--------------+ GSV      None           No       No         mid calf to    Acute                                                      ankle                         +---------+---------------+---------+-----------+---------------+--------------+ VV's     None           No       No         Distal medial  Acute                                                      thigh & mid                                                               calf                          +---------+---------------+---------+-----------+---------------+--------------+   +----+---------------+---------+-----------+----------+--------------+ LEFTCompressibilityPhasicitySpontaneityPropertiesThrombus Aging +----+---------------+---------+-----------+----------+--------------+ CFV Full           Yes      Yes                                 +----+---------------+---------+-----------+----------+--------------+     Summary: RIGHT: - Findings consistent with acute superficial vein thrombosis involving the right great saphenous vein, and right varicosities or other superficial veins.  - There is no evidence of deep vein thrombosis in the lower extremity.  - No cystic structure found in the popliteal fossa.  LEFT: - No evidence of common  femoral vein obstruction.   *See table(s) above for measurements and observations. Electronically signed by Lemar Livings MD on 11/19/2023 at 7:46:54 PM.    Final    DG Chest 2 View Result Date: 11/19/2023 CLINICAL DATA:  Shortness of breath and cough, recent prostate surgery 1 week ago, fever last night EXAM:  CHEST - 2 VIEW COMPARISON:  04/06/2019 FINDINGS: The heart size and mediastinal contours are within normal limits. Similar decreased lung volumes. Both lungs are clear. Negative for edema, effusion or pneumothorax. Trachea midline. The visualized skeletal structures are unremarkable. IMPRESSION: Low volume exam without acute process. Electronically Signed   By: Judie Petit.  Shick M.D.   On: 11/19/2023 16:31    I independently reviewed the above imaging studies.  Impression/Recommendation S/p robotic simple prostatectomy 3 weeks ago Post op DVT/PE Bilateral epididymorchitis/cystitis-  on broad spectrum antibiotics pending culture results.  Expect this to resolve with antibiotic therapy.  Will follow.   Joline Maxcy 11/20/2023, 8:02 AM

## 2023-11-20 NOTE — Progress Notes (Signed)
 PHARMACY - ANTICOAGULATION CONSULT NOTE  Pharmacy Consult for apixaban Indication: pulmonary embolus  No Known Allergies  Patient Measurements: Height: 6\' 2"  (188 cm) Weight: 91 kg (200 lb 9.9 oz) IBW/kg (Calculated) : 82.2 HEPARIN DW (KG): 91  Vital Signs: Temp: 98.4 F (36.9 C) (04/09 0610) Temp Source: Oral (04/09 0610) BP: 104/68 (04/09 0610) Pulse Rate: 66 (04/09 0610)  Labs: Recent Labs    11/19/23 1200 11/19/23 1700 11/19/23 2250 11/20/23 0428 11/20/23 0540  HGB 14.0  --   --  12.1*  --   HCT 40.4  --   --  35.9*  --   PLT 259  --   --  194  --   APTT  --   --   --  46*  --   LABPROT  --   --   --  15.4*  --   INR  --   --   --  1.2  --   HEPARINUNFRC  --   --   --  0.14*  --   CREATININE 1.65*  --   --   --  1.31*  TROPONINIHS  --  6 7  --   --     Estimated Creatinine Clearance: 68.8 mL/min (A) (by C-G formula based on SCr of 1.31 mg/dL (H)).   Medical History: Past Medical History:  Diagnosis Date   Headache    few times a month   HIV (human immunodeficiency virus infection) (HCC)    Assessment: 63 YO male presenting with worsening symptoms of scrotal cellulitis, right leg swelling, chest pain and shortness of breath. Doppler showing acute superficial vein thrombosis. CTA PE positive for PE. Patient is not on anticoagulation PTA. Pharmacy was initially consulted for heparin dosing, and now transition to apixaban.   Today, 11/20/23: CBC: Hgb 12.1-slightly low, pltc WNL No bleeding or infusion related issues reported by RN  Goal of Therapy:  Heparin level 0.3-0.7 units/ml Monitor platelets by anticoagulation protocol: Yes   Plan:  Apixaban 10mg  PO BID x7 days, followed by 5mg  PO BID Pharmacy to provide education and coupon prior to discharge.    Lynann Beaver PharmD, BCPS [facility] main pharmacy [phone] 11/20/2023 7:49 AM

## 2023-11-20 NOTE — Subjective & Objective (Signed)
 Patient seen and examined.  Patient with persistent left testicular pain. Urine cx grew E. Coli sensitive to cipro. Pt has been on levaquin. Less left testicle pain. Able to ambulate with PT yesterday. Ready for DC to home.

## 2023-11-21 ENCOUNTER — Encounter (HOSPITAL_COMMUNITY): Payer: Self-pay | Admitting: Internal Medicine

## 2023-11-21 DIAGNOSIS — I2699 Other pulmonary embolism without acute cor pulmonale: Secondary | ICD-10-CM | POA: Diagnosis not present

## 2023-11-21 DIAGNOSIS — N452 Orchitis: Secondary | ICD-10-CM | POA: Diagnosis not present

## 2023-11-21 DIAGNOSIS — N3090 Cystitis, unspecified without hematuria: Secondary | ICD-10-CM | POA: Diagnosis not present

## 2023-11-21 DIAGNOSIS — N1831 Chronic kidney disease, stage 3a: Secondary | ICD-10-CM | POA: Diagnosis not present

## 2023-11-21 LAB — CBC WITH DIFFERENTIAL/PLATELET
Abs Immature Granulocytes: 0.06 10*3/uL (ref 0.00–0.07)
Basophils Absolute: 0 10*3/uL (ref 0.0–0.1)
Basophils Relative: 0 %
Eosinophils Absolute: 0.4 10*3/uL (ref 0.0–0.5)
Eosinophils Relative: 6 %
HCT: 36.2 % — ABNORMAL LOW (ref 39.0–52.0)
Hemoglobin: 12.5 g/dL — ABNORMAL LOW (ref 13.0–17.0)
Immature Granulocytes: 1 %
Lymphocytes Relative: 20 %
Lymphs Abs: 1.4 10*3/uL (ref 0.7–4.0)
MCH: 30.6 pg (ref 26.0–34.0)
MCHC: 34.5 g/dL (ref 30.0–36.0)
MCV: 88.5 fL (ref 80.0–100.0)
Monocytes Absolute: 0.7 10*3/uL (ref 0.1–1.0)
Monocytes Relative: 11 %
Neutro Abs: 4.3 10*3/uL (ref 1.7–7.7)
Neutrophils Relative %: 62 %
Platelets: 232 10*3/uL (ref 150–400)
RBC: 4.09 MIL/uL — ABNORMAL LOW (ref 4.22–5.81)
RDW: 13.4 % (ref 11.5–15.5)
WBC: 6.9 10*3/uL (ref 4.0–10.5)
nRBC: 0 % (ref 0.0–0.2)

## 2023-11-21 LAB — CALCIUM, IONIZED: Calcium, Ionized, Serum: 5.3 mg/dL (ref 4.5–5.6)

## 2023-11-21 LAB — URINE CULTURE: Culture: >=100000 — AB

## 2023-11-21 LAB — BASIC METABOLIC PANEL WITH GFR
Anion gap: 8 (ref 5–15)
BUN: 15 mg/dL (ref 8–23)
CO2: 25 mmol/L (ref 22–32)
Calcium: 10 mg/dL (ref 8.9–10.3)
Chloride: 103 mmol/L (ref 98–111)
Creatinine, Ser: 1.53 mg/dL — ABNORMAL HIGH (ref 0.61–1.24)
GFR, Estimated: 51 mL/min — ABNORMAL LOW (ref >60)
Glucose, Bld: 108 mg/dL — ABNORMAL HIGH (ref 70–99)
Potassium: 4 mmol/L (ref 3.5–5.1)
Sodium: 136 mmol/L (ref 135–145)

## 2023-11-21 LAB — PARATHYROID HORMONE, INTACT (NO CA): PTH: 41 pg/mL (ref 15–65)

## 2023-11-21 MED ORDER — LEVOFLOXACIN 500 MG PO TABS
500.0000 mg | ORAL_TABLET | Freq: Every day | ORAL | Status: DC
  Administered 2023-11-21 – 2023-11-22 (×2): 500 mg via ORAL
  Filled 2023-11-21 (×2): qty 1

## 2023-11-21 NOTE — Discharge Instructions (Signed)
 Information on my medicine - ELIQUIS (apixaban)  Why was Eliquis prescribed for you? Eliquis was prescribed to treat blood clots that may have been found in the veins of your legs (deep vein thrombosis) or in your lungs (pulmonary embolism) and to reduce the risk of them occurring again.  What do You need to know about Eliquis ? The starting dose is 10 mg (two 5 mg tablets) taken TWICE daily for the FIRST SEVEN (7) DAYS, then on (enter date)  11/27/22  the dose is reduced to ONE 5 mg tablet taken TWICE daily.  Eliquis may be taken with or without food.   Try to take the dose about the same time in the morning and in the evening. If you have difficulty swallowing the tablet whole please discuss with your pharmacist how to take the medication safely.  Take Eliquis exactly as prescribed and DO NOT stop taking Eliquis without talking to the doctor who prescribed the medication.  Stopping may increase your risk of developing a new blood clot.  Refill your prescription before you run out.  After discharge, you should have regular check-up appointments with your healthcare provider that is prescribing your Eliquis.    What do you do if you miss a dose? If a dose of ELIQUIS is not taken at the scheduled time, take it as soon as possible on the same day and twice-daily administration should be resumed. The dose should not be doubled to make up for a missed dose.  Important Safety Information A possible side effect of Eliquis is bleeding. You should call your healthcare provider right away if you experience any of the following: Bleeding from an injury or your nose that does not stop. Unusual colored urine (red or dark brown) or unusual colored stools (red or black). Unusual bruising for unknown reasons. A serious fall or if you hit your head (even if there is no bleeding).  Some medicines may interact with Eliquis and might increase your risk of bleeding or clotting while on Eliquis. To  help avoid this, consult your healthcare provider or pharmacist prior to using any new prescription or non-prescription medications, including herbals, vitamins, non-steroidal anti-inflammatory drugs (NSAIDs) and supplements.  This website has more information on Eliquis (apixaban): http://www.eliquis.com/eliquis/home

## 2023-11-21 NOTE — Assessment & Plan Note (Signed)
 11-21-2023 Dulcolax 10 mg p.o. and 34 mg of MiraLAX give him a bowel movement yesterday.  He is very happy about this.  11-22-2023 stable.

## 2023-11-21 NOTE — Evaluation (Signed)
 Physical Therapy Evaluation Patient Details Name: Billy [name] MRN: [id number] DOB: [birth year] Today's Date: 11/21/2023  History of Present Illness  63 y.o. male admitted with fever, SOB, scrotal pain. Dx of PE, orchitis. Pt  with medical history significant of HIV, BPH s/p "simple prostatectomy" on 10/25/2023 robot assisted.  Clinical Impression  Pt is mobilizing at an independent level. He ambulated 250' without an assistive device, no loss of balance, wide base of support 2* scrotal edema. 0/4 dyspnea with ambulation, SpO2 100% on room air. He is ready to DC home from a PT standpoint. No further PT indicated. Encouraged pt to ambulate in the halls several times/day to minimize deconditioning during hospitalization.      If plan is discharge home, recommend the following:     Can travel by private vehicle        Equipment Recommendations None recommended by PT  Recommendations for Other Services       Functional Status Assessment Patient has not had a recent decline in their functional status     Precautions / Restrictions Precautions Precautions: None Restrictions Weight Bearing Restrictions Per Provider Order: No      Mobility  Bed Mobility Overal bed mobility: Independent                  Transfers Overall transfer level: Independent                      Ambulation/Gait Ambulation/Gait assistance: Independent Gait Distance (Feet): 250 Feet Assistive device: None Gait Pattern/deviations: Wide base of support, WFL(Within Functional Limits) Gait velocity: mildly decr but WFL     General Gait Details: wide BOS 2* scrotal edema, no loss of balance, pt denied dyspnea, SpO2 100% on room air  Stairs            Wheelchair Mobility     Tilt Bed    Modified Rankin (Stroke Patients Only)       Balance Overall balance assessment: Independent                                           Pertinent Vitals/Pain Pain  Assessment Pain Assessment: No/denies pain    Home Living Family/patient expects to be discharged to:: Private residence Living Arrangements: Spouse/significant other Available Help at Discharge: Family Type of Home: House Home Access: Stairs to enter   Secretary/administrator of Steps: 4   Home Layout: One level Home Equipment: None      Prior Function Prior Level of Function : Independent/Modified Independent             Mobility Comments: denies falls in past 6 months, walks without AD ADLs Comments: independent     Extremity/Trunk Assessment   Upper Extremity Assessment Upper Extremity Assessment: Overall WFL for tasks assessed    Lower Extremity Assessment Lower Extremity Assessment: Overall WFL for tasks assessed    Cervical / Trunk Assessment Cervical / Trunk Assessment: Normal  Communication   Communication Communication: No apparent difficulties    Cognition Arousal: Alert Behavior During Therapy: WFL for tasks assessed/performed   PT - Cognitive impairments: No apparent impairments                         Following commands: Intact       Cueing       General Comments  Exercises     Assessment/Plan    PT Assessment Patient does not need any further PT services  PT Problem List         PT Treatment Interventions      PT Goals (Current goals can be found in the Care Plan section)  Acute Rehab PT Goals Patient Stated Goal: likes to watch action movies PT Goal Formulation: All assessment and education complete, DC therapy    Frequency       Co-evaluation               AM-PAC PT "6 Clicks" Mobility  Outcome Measure Help needed turning from your back to your side while in a flat bed without using bedrails?: None Help needed moving from lying on your back to sitting on the side of a flat bed without using bedrails?: None Help needed moving to and from a bed to a chair (including a wheelchair)?: None Help  needed standing up from a chair using your arms (e.g., wheelchair or bedside chair)?: None Help needed to walk in hospital room?: None Help needed climbing 3-5 steps with a railing? : None 6 Click Score: 24    End of Session Equipment Utilized During Treatment: Gait belt Activity Tolerance: Patient tolerated treatment well Patient left: in bed;with call bell/phone within reach;with family/visitor present Nurse Communication: Mobility status      Time: 7829-5621 PT Time Calculation (min) (ACUTE ONLY): 11 min   Charges:   PT Evaluation $PT Eval Low Complexity: 1 Low   PT General Charges $$ ACUTE PT VISIT: 1 Visit         Tamala Ser PT 11/21/2023  Acute Rehabilitation Services  Office [phone]

## 2023-11-21 NOTE — Progress Notes (Signed)
 PROGRESS NOTE    Billy [name]  ZOX:[id number] DOB: 11-18-1960 DOA: 11/19/2023 PCP: Montez Hageman, DO  Subjective: Patient seen and examined.  Patient with persistent left testicular pain. Blood cultures are no growth to date however he has greater than 100,000 colony-forming units gram-negative rods in his urine culture.  C4 counts 593.  He is afebrile.  White count 6.9.  He is yet to be out of bed and ambulating.   Hospital Course: HPI: Billy [name] is a 63 y.o. male with medical history significant of BPH LUTS s.p "simple prostatectomy" on 10/25/2023 robot assisted. Umbilical hernia repair.   Since then patinet reports some scrotal pain. Patient has recived outpatient doxycycline for same.   Paitnet returned to ER today due to several new symptoms : patinet apparently had a fever last night and difficulty with urination flow. No abd pain. Paitnet has had persistent minimal hematuria sicne the time of his surgery. No diarrhea, no vomting. Patient also reports new onset cough, sob and chest pain pleuritic since this AM . No expectoration. Chest pain only with cuohging. Sob with couhging. No leg swelling, no rash on skin. He also reports right lower extremity discomfort medial thigh distally.  Significant Events: Admitted 11/19/2023 for acute PE   Significant Labs: WBC 9.3, HgB 14, plt 259 UA, turbid, HgB mod, Nitrite Positive, LE large, WBC >50 Na 135, K 3.8, CO2 of 25, BUN 16, scr 1.65  Significant Imaging Studies: CXR Low volume exam without acute process.  CTPA/CT abd/pelvis Positive for small volume acute bilateral pulmonary emboli. Slightly elevated RV LV ratio 0.97. 2. Status post prostatectomy. Mild stranding between the bladder and seminal vesicles and  surrounding the seminal vesicles, question postsurgical change versus infection. Slightly thick-walled urinary bladder with perivesical stranding and mucosal enhancement, suspicious for cystitis. Mild distal  urothelial thickening and stranding, possible ascending urinary tract infection. No evidence  for rim enhancing abscess at the surgical bed 3. Large bilateral scrotal hydroceles and suspected diffuse scrotal thickening. No soft tissue gas at the scrotum or perineum to suggest necrotic infection. Consider correlation with scrotal ultrasound 4. Aortic atherosclerosis. Scrotal U/S Negative for testicular torsion or mass. 2. Enlarged heterogeneous and hypervascular epididymides bilaterally suggestive of epididymitis. 3. Small bilateral hydroceles containing particulate debris. 4. Diffuse scrotal wall thickening  Antibiotic Therapy: Anti-infectives (From admission, onward)    Start     Dose/Rate Route Frequency Ordered Stop   11/20/23 1700  vancomycin (VANCOREADY) IVPB 1500 mg/300 mL       Placed in "Followed by" Linked Group   1,500 mg 150 mL/hr over 120 Minutes Intravenous Every 24 hours 11/19/23 1618     11/20/23 1000  bictegravir-emtricitabine-tenofovir AF (BIKTARVY) 50-200-25 MG per tablet 1 tablet        1 tablet Oral Daily 11/19/23 2151     11/20/23 0500  cefTRIAXone (ROCEPHIN) 2 g in sodium chloride 0.9 % 100 mL IVPB        2 g 200 mL/hr over 30 Minutes Intravenous  Once 11/19/23 2138 11/20/23 0556   11/19/23 2200  levofloxacin (LEVAQUIN) IVPB 500 mg        500 mg 100 mL/hr over 60 Minutes Intravenous Every 24 hours 11/19/23 2138 11/29/23 2159   11/19/23 1700  vancomycin (VANCOREADY) IVPB 2000 mg/400 mL       Placed in "Followed by" Linked Group   2,000 mg 200 mL/hr over 120 Minutes Intravenous  Once 11/19/23 1618 11/19/23 2033   11/19/23 1630  ceFEPIme (MAXIPIME) 2 g  in sodium chloride 0.9 % 100 mL IVPB  Status:  Discontinued        2 g 200 mL/hr over 30 Minutes Intravenous Every 12 hours 11/19/23 1618 11/19/23 2138       Procedures:   Consultants: urology    Assessment and Plan: * Acute pulmonary embolism (HCC) On admission. Heparin infusion started. I do not see  indication for thrombolysis at thsi time. No cor pulmonale. Check echo.  11-20-2023 echo negative for RV dysfunction.  He only has superficial thrombophlebitis on his lower extremity ultrasounds.  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  Stop IV heparin.  Change over to p.o. Eliquis.  11-21-2023 tolerating Eliquis.  On room air.  Superficial vein thrombosis On admission. On right with  pulm embolism. Started on heaprin infusion - c.w. same. Patient advised to report any new symptoms headche, skin bruising, severe back pain to staff promptly.  11-20-2023 LE U/S negative for DVT. Has superficial thrombophlebitis. But with his acute PE he most likely has acute LE DVT. Change IV heparin to po Eliquis.  11-21-2023 continue with p.o. Eliquis.  He will need 6 months of therapy due to his PE.  Cystitis On admission. See on imaging . Check urine culture. Abx as above.  11-20-2023 change abx to levaquin. Discussed with urology. No operative intervention needed.   11-21-2023  Urine culture growing gram-negative rods.  Awaiting final ID.  On Levaquin.  Orchitis On admission. Left. S.p vanco+cefepime in the ER. Give one dose of ceftriaxone and rx with 10 days of levoflxacin. GC probe pending for urine. I found the left testicle pretty hard. Look forward to Dr. Margo Aye (urology eval in this regard)  11-20-2023 change abx to levaquin. Discussed with urology. No operative intervention needed.   11-21-2023 changed to p.o. Levaquin.  Awaiting urine culture.  Gonorrhea and Chlamydia probe are negative.  Hypercalcemia On admission Chronic. Will send work up in AM  11-20-2023 vitamin D level is now. PTH pending  11-21-2023 resolved  CKD stage 3a, GFR 45-59 ml/min (HCC) - baseline scR 1.3-1.5 11-20-2023 Baseline scr 1.3-1.5  11-21-2023 stable  Human immunodeficiency virus (HIV) disease (HCC) 11-20-2023 continue with [facility]  11-21-2023 continue Biktarvy.  CD4 count is normal at  593.  CONSTIPATION, CHRONIC 11-21-2023 Dulcolax 10 mg p.o. and 34 mg of MiraLAX give him a bowel movement yesterday.  He is very happy about this.  DVT prophylaxis:  apixaban (ELIQUIS) tablet 10 mg  apixaban (ELIQUIS) tablet 5 mg     Code Status: Full Code Family Communication: no family at bedside. Pt is decisional Disposition Plan: return home Reason for continuing need for hospitalization: awaiting urine cx results. Pt needs to ambulate today. DC purewick. Likely home tomorrow.  Objective: Vitals:   11/20/23 1405 11/20/23 1749 11/20/23 2157 11/21/23 0604  BP: 129/75 129/83 113/70 117/82  Pulse: 72 75 77 62  Resp: 18 (!) 22 17 16   Temp: 98.3 F (36.8 C) 99.4 F (37.4 C) 99.3 F (37.4 C) 98.5 F (36.9 C)  TempSrc: Oral Oral Oral Oral  SpO2: 100% 99% 100% 100%  Weight:      Height:        Intake/Output Summary (Last 24 hours) at 11/21/2023 1021 Last data filed at 11/21/2023 0809 Gross per 24 hour  Intake 742.99 ml  Output 1575 ml  Net -832.01 ml   Filed Weights   11/19/23 1151  Weight: 91 kg    Examination:  Physical Exam Vitals and nursing note reviewed.  Constitutional:      General: He is not in acute distress.    Appearance: He is not toxic-appearing or diaphoretic.  HENT:     Head: Normocephalic and atraumatic.     Nose: Nose normal.  Eyes:     General: No scleral icterus. Cardiovascular:     Rate and Rhythm: Normal rate and regular rhythm.  Pulmonary:     Effort: Pulmonary effort is normal.     Breath sounds: Normal breath sounds.  Abdominal:     General: Abdomen is flat. Bowel sounds are normal. There is no distension.     Palpations: Abdomen is soft.     Tenderness: There is no abdominal tenderness.  Genitourinary:    Comments: Left testicle enlarged and still tender Skin:    General: Skin is warm and dry.     Capillary Refill: Capillary refill takes less than 2 seconds.  Neurological:     General: No focal deficit present.     Mental  Status: He is alert and oriented to person, place, and time.     Data Reviewed: I have personally reviewed following labs and imaging studies  CBC: Recent Labs  Lab 11/19/23 1200 11/20/23 0428 11/21/23 0429  WBC 9.3 7.6 6.9  NEUTROABS  --   --  4.3  HGB 14.0 12.1* 12.5*  HCT 40.4 35.9* 36.2*  MCV 87.8 89.1 88.5  PLT 259 194 232   Basic Metabolic Panel: Recent Labs  Lab 11/19/23 1200 11/20/23 0540 11/21/23 0429  NA 135 132* 136  K 3.8 3.7 4.0  CL 99 100 103  CO2 25 23 25   GLUCOSE 123* 102* 108*  BUN 16 16 15   CREATININE 1.65* 1.31* 1.53*  CALCIUM 10.5* 9.8 10.0  PHOS  --  3.6  --    GFR: Estimated Creatinine Clearance: 58.9 mL/min (A) (by C-G formula based on SCr of 1.53 mg/dL (H)). Liver Function Tests: Recent Labs  Lab 11/19/23 1200  AST 19  ALT 18  ALKPHOS 75  BILITOT 0.8  PROT 7.8  ALBUMIN 3.6   Recent Labs  Lab 11/19/23 1200  LIPASE 22   Coagulation Profile: Recent Labs  Lab 11/20/23 0428  INR 1.2   BNP (last 3 results) Recent Labs    11/19/23 1700  BNP 54.2   Sepsis Labs: Recent Labs  Lab 11/19/23 1934  LATICACIDVEN 0.8    Recent Results (from the past 240 hours)  Blood culture (routine x 2)     Status: None (Preliminary result)   Collection Time: 11/19/23  5:00 PM   Specimen: BLOOD  Result Value Ref Range Status   Specimen Description   Final    BLOOD BLOOD LEFT ARM Performed at [facility], [address] [place], [place] [address]    Special Requests   Final    BOTTLES DRAWN AEROBIC AND ANAEROBIC Blood Culture adequate volume Performed at [facility], [address] [place], [place] [address]    Culture   Final    NO GROWTH 2 DAYS Performed at [facility], [address] [place], [place] [address]    Report Status PENDING  Incomplete  Blood culture (routine x 2)     Status: None (Preliminary result)   Collection Time: 11/19/23  5:28 PM   Specimen: BLOOD  Result Value  Ref Range Status   Specimen Description   Final    BLOOD SITE NOT SPECIFIED Performed at [facility], [address]  [place], [place] [address]    Special Requests   Final    BOTTLES DRAWN AEROBIC AND ANAEROBIC Blood Culture adequate volume Performed at [facility], [address], [place], [place] [address]    Culture   Final    NO GROWTH 2 DAYS Performed at [facility], [address] [place], [place] [address]    Report Status PENDING  Incomplete  Culture, Urine (Do not remove urinary catheter, catheter placed by urology or difficult to place)     Status: Abnormal (Preliminary result)   Collection Time: 11/19/23  9:45 PM   Specimen: Urine, Catheterized  Result Value Ref Range Status   Specimen Description   Final    URINE, CATHETERIZED Performed at [facility], [address] [place], [place] [address]    Special Requests   Final    NONE Performed at [facility], [address] [place], [place] [address]    Culture (A)  Final    >=100,000 COLONIES/mL GRAM NEGATIVE RODS IDENTIFICATION AND SUSCEPTIBILITIES TO FOLLOW Performed at [facility], [address] [place], [place] [address]    Report Status PENDING  Incomplete     Radiology Studies: ECHOCARDIOGRAM COMPLETE Result Date: 11/20/2023    ECHOCARDIOGRAM REPORT   Patient Name:   Billy [name] Date of Exam: 11/20/2023 Medical Rec #:  [id number]           Height:       74.0 in Accession #:    [id number]          Weight:       200.6 lb Date of Birth:  Dec 30, 1960            BSA:          2.176 m Patient Age:    63 years            BP:           104/68 mmHg Patient Gender: M                   HR:           66 bpm. Exam Location:  Inpatient Procedure: 2D Echo, Cardiac Doppler and Color Doppler (Both Spectral and Color            Flow Doppler were utilized during procedure). Indications:    I26.02 Pulmonary embolus  History:         Patient has no prior history of Echocardiogram examinations.                 Signs/Symptoms:Shortness of Breath, Chest Pain and Dyspnea.  Sonographer:    Sheralyn Boatman RDCS Referring Phys: [id number] [facility] GOEL IMPRESSIONS  1. Left ventricular ejection fraction, by estimation, is 50 to 55%. The left ventricle has low normal function. The left ventricle demonstrates regional wall motion abnormalities (see scoring diagram/findings for description). Left ventricular diastolic  parameters are consistent with Grade I diastolic dysfunction (impaired relaxation). There is mild hypokinesis of the left ventricular, basal-mid inferolateral wall.  2. Right ventricular systolic function is normal. The right ventricular size is normal.  3. The mitral valve is normal in structure. Trivial mitral valve regurgitation. No evidence of mitral stenosis.  4. The aortic valve is tricuspid. Aortic valve regurgitation is not visualized. No aortic stenosis is present.  5. The inferior vena cava is dilated in size with >50% respiratory variability, suggesting right atrial pressure of 8 mmHg.  6. Cannot  exclude a small PFO. Comparison(s): No prior Echocardiogram. Conclusion(s)/Recommendation(s): Otherwise normal echocardiogram, with minor abnormalities described in the report. FINDINGS  Left Ventricle: Left ventricular ejection fraction, by estimation, is 50 to 55%. The left ventricle has low normal function. The left ventricle demonstrates regional wall motion abnormalities. Mild hypokinesis of the left ventricular, basal-mid inferolateral wall. The left ventricular internal cavity size was normal in size. There is no left ventricular hypertrophy. Left ventricular diastolic parameters are consistent with Grade I diastolic dysfunction (impaired relaxation). Right Ventricle: The right ventricular size is normal. No increase in right ventricular wall thickness. Right ventricular systolic function is normal. Left Atrium: Left atrial size was normal in  size. Right Atrium: Right atrial size was normal in size. Pericardium: There is no evidence of pericardial effusion. Mitral Valve: Redundant chordae noted. The mitral valve is normal in structure. Trivial mitral valve regurgitation. No evidence of mitral valve stenosis. Tricuspid Valve: The tricuspid valve is normal in structure. Tricuspid valve regurgitation is trivial. No evidence of tricuspid stenosis. Aortic Valve: The aortic valve is tricuspid. Aortic valve regurgitation is not visualized. No aortic stenosis is present. Pulmonic Valve: The pulmonic valve was not well visualized. Pulmonic valve regurgitation is not visualized. No evidence of pulmonic stenosis. Aorta: The aortic root and ascending aorta are structurally normal, with no evidence of dilitation. Venous: The inferior vena cava is dilated in size with greater than 50% respiratory variability, suggesting right atrial pressure of 8 mmHg. IAS/Shunts: Cannot exclude a small PFO.  LEFT VENTRICLE PLAX 2D LVIDd:         5.20 cm      Diastology LVIDs:         4.00 cm      LV e' medial:    6.09 cm/s LV PW:         1.10 cm      LV E/e' medial:  8.1 LV IVS:        1.20 cm      LV e' lateral:   10.10 cm/s LVOT diam:     2.70 cm      LV E/e' lateral: 4.9 LV SV:         86 LV SV Index:   40 LVOT Area:     5.73 cm  LV Volumes (MOD) LV vol d, MOD A2C: 113.0 ml LV vol d, MOD A4C: 70.2 ml LV vol s, MOD A2C: 50.2 ml LV vol s, MOD A4C: 39.5 ml LV SV MOD A2C:     62.8 ml LV SV MOD A4C:     70.2 ml LV SV MOD BP:      47.3 ml RIGHT VENTRICLE             IVC RV S prime:     18.10 cm/s  IVC diam: 2.20 cm TAPSE (M-mode): 1.9 cm LEFT ATRIUM             Index        RIGHT ATRIUM           Index LA diam:        2.90 cm 1.33 cm/m   RA Area:     11.90 cm LA Vol (A2C):   23.2 ml 10.66 ml/m  RA Volume:   22.70 ml  10.43 ml/m LA Vol (A4C):   46.5 ml 21.37 ml/m LA Biplane Vol: 35.0 ml 16.08 ml/m  AORTIC VALVE LVOT Vmax:   79.20 cm/s LVOT Vmean:  50.100 cm/s LVOT VTI:    0.151 m  AORTA Ao Root diam: 3.50 cm Ao Asc diam:  3.30 cm MITRAL VALVE MV Area (PHT): 3.17 cm    SHUNTS MV Decel Time: 239 msec    Systemic VTI:  0.15 m MV E velocity: 49.40 cm/s  Systemic Diam: 2.70 cm MV A velocity: 44.70 cm/s MV E/A ratio:  1.11 Jodelle Red MD Electronically signed by Jodelle Red MD Signature Date/Time: 11/20/2023/1:22:07 PM    Final    US SCROTUM W/DOPPLER Result Date: 11/19/2023 CLINICAL DATA:  Scrotal pain and swelling EXAM: SCROTAL ULTRASOUND DOPPLER ULTRASOUND OF THE TESTICLES TECHNIQUE: Complete ultrasound examination of the testicles, epididymis, and other scrotal structures was performed. Color and spectral Doppler ultrasound were also utilized to evaluate blood flow to the testicles. COMPARISON:  CT 11/19/2023 FINDINGS: Right testicle Measurements: 4.6 x 2.6 x 3 cm. No mass or microlithiasis visualized. Left testicle Measurements: 4 x 1.9 x 2.6 cm. No mass or microlithiasis visualized. Right epididymis: Enlarged heterogeneous and hypervascular. Small cyst measuring 3 mm Left epididymis:  Enlarged heterogeneous and hypervascular. Hydrocele: Small bilateral hydroceles containing particulate debris. Varicocele:  None visualized. Pulsed Doppler interrogation of both testes demonstrates normal low resistance arterial and venous waveforms bilaterally. Diffuse scrotal wall thickening. IMPRESSION: 1. Negative for testicular torsion or mass. 2. Enlarged heterogeneous and hypervascular epididymides bilaterally suggestive of epididymitis. 3. Small bilateral hydroceles containing particulate debris. 4. Diffuse scrotal wall thickening. Electronically Signed   By: Jasmine Pang M.D.   On: 11/19/2023 23:31   CT ABDOMEN PELVIS W CONTRAST Result Date: 11/19/2023 CLINICAL DATA:  High probability PE, recent surgery on prostate EXAM: CT ANGIOGRAPHY CHEST CT ABDOMEN AND PELVIS WITH CONTRAST TECHNIQUE: Multidetector CT imaging of the chest was performed using the standard protocol during bolus  administration of intravenous contrast. Multiplanar CT image reconstructions and MIPs were obtained to evaluate the vascular anatomy. Multidetector CT imaging of the abdomen and pelvis was performed using the standard protocol during bolus administration of intravenous contrast. RADIATION DOSE REDUCTION: This exam was performed according to the departmental dose-optimization program which includes automated exposure control, adjustment of the mA and/or kV according to patient size and/or use of iterative reconstruction technique. CONTRAST:  OMNIPAQUE IOHEXOL 350 MG/ML SOLN COMPARISON:  MRI 11/03/2021 FINDINGS: CTA CHEST FINDINGS Cardiovascular: Satisfactory opacification of the pulmonary arteries to the segmental level. No evidence of pulmonary embolism. Positive for small volume acute bilateral pulmonary emboli. Small volume thrombus within distal left upper pulmonary artery with multiple small segmental and subsegmental left upper lobe pulmonary emboli. Small volume right upper lobe segmental and subsegmental and right lower lobe subsegmental pulmonary emboli. RV LV ratio 0.97. Nonaneurysmal aorta. Normal cardiac size. No pericardial effusion Mediastinum/Nodes: Patent trachea. No thyroid mass. No suspicious lymph nodes. Esophagus within normal limits. Lungs/Pleura: No pleural effusion or pneumothorax. Subsegmental atelectasis in the right upper lobe. Musculoskeletal: No acute osseous abnormality. Review of the MIP images confirms the above findings. CT ABDOMEN and PELVIS FINDINGS Hepatobiliary: Subcentimeter hypodensity in the liver too small to further characterize. No calcified gallstone or biliary dilatation Pancreas: Unremarkable. No pancreatic ductal dilatation or surrounding inflammatory changes. Spleen: Normal in size without focal abnormality. Adrenals/Urinary Tract: Adrenal glands are normal. Fullness of the renal collecting systems without obstructing stone. Mild distal urothelial thickening and  stranding. Slightly thick-walled urinary bladder with perivesical stranding and mucosal enhancement. Stomach/Bowel: Stomach is within normal limits. Appendix appears normal. No evidence of bowel wall thickening, distention, or inflammatory changes. Vascular/Lymphatic: Aortic atherosclerosis. No enlarged abdominal or pelvic lymph nodes. Reproductive: Status post prostatectomy.  Mild stranding between the bladder and seminal vesicles and surrounding the seminal vesicles. Large bilateral scrotal hydroceles and suspected diffuse scrotal thickening. No soft tissue gas at the scrotum or perineum to suggest necrotic infection Other: No free air Musculoskeletal: No acute osseous abnormality Review of the MIP images confirms the above findings. IMPRESSION: 1. Positive for small volume acute bilateral pulmonary emboli. Slightly elevated RV LV ratio 0.97. 2. Status post prostatectomy. Mild stranding between the bladder and seminal vesicles and surrounding the seminal vesicles, question postsurgical change versus infection. Slightly thick-walled urinary bladder with perivesical stranding and mucosal enhancement, suspicious for cystitis. Mild distal urothelial thickening and stranding, possible ascending urinary tract infection. No evidence for rim enhancing abscess at the surgical bed 3. Large bilateral scrotal hydroceles and suspected diffuse scrotal thickening. No soft tissue gas at the scrotum or perineum to suggest necrotic infection. Consider correlation with scrotal ultrasound 4. Aortic atherosclerosis. Critical Value/emergent results were called by telephone at the time of interpretation on 11/19/2023 at 7:06 Pm to provider [facility] , who verbally acknowledged these results. Electronically Signed   By: Jasmine Pang M.D.   On: 11/19/2023 20:09   CT Angio Chest Pulmonary Embolism (PE) W or WO Contrast Result Date: 11/19/2023 CLINICAL DATA:  High probability PE, recent surgery on prostate EXAM: CT ANGIOGRAPHY CHEST CT  ABDOMEN AND PELVIS WITH CONTRAST TECHNIQUE: Multidetector CT imaging of the chest was performed using the standard protocol during bolus administration of intravenous contrast. Multiplanar CT image reconstructions and MIPs were obtained to evaluate the vascular anatomy. Multidetector CT imaging of the abdomen and pelvis was performed using the standard protocol during bolus administration of intravenous contrast. RADIATION DOSE REDUCTION: This exam was performed according to the departmental dose-optimization program which includes automated exposure control, adjustment of the mA and/or kV according to patient size and/or use of iterative reconstruction technique. CONTRAST:  OMNIPAQUE IOHEXOL 350 MG/ML SOLN COMPARISON:  MRI 11/03/2021 FINDINGS: CTA CHEST FINDINGS Cardiovascular: Satisfactory opacification of the pulmonary arteries to the segmental level. No evidence of pulmonary embolism. Positive for small volume acute bilateral pulmonary emboli. Small volume thrombus within distal left upper pulmonary artery with multiple small segmental and subsegmental left upper lobe pulmonary emboli. Small volume right upper lobe segmental and subsegmental and right lower lobe subsegmental pulmonary emboli. RV LV ratio 0.97. Nonaneurysmal aorta. Normal cardiac size. No pericardial effusion Mediastinum/Nodes: Patent trachea. No thyroid mass. No suspicious lymph nodes. Esophagus within normal limits. Lungs/Pleura: No pleural effusion or pneumothorax. Subsegmental atelectasis in the right upper lobe. Musculoskeletal: No acute osseous abnormality. Review of the MIP images confirms the above findings. CT ABDOMEN and PELVIS FINDINGS Hepatobiliary: Subcentimeter hypodensity in the liver too small to further characterize. No calcified gallstone or biliary dilatation Pancreas: Unremarkable. No pancreatic ductal dilatation or surrounding inflammatory changes. Spleen: Normal in size without focal abnormality. Adrenals/Urinary  Tract: Adrenal glands are normal. Fullness of the renal collecting systems without obstructing stone. Mild distal urothelial thickening and stranding. Slightly thick-walled urinary bladder with perivesical stranding and mucosal enhancement. Stomach/Bowel: Stomach is within normal limits. Appendix appears normal. No evidence of bowel wall thickening, distention, or inflammatory changes. Vascular/Lymphatic: Aortic atherosclerosis. No enlarged abdominal or pelvic lymph nodes. Reproductive: Status post prostatectomy. Mild stranding between the bladder and seminal vesicles and surrounding the seminal vesicles. Large bilateral scrotal hydroceles and suspected diffuse scrotal thickening. No soft tissue gas at the scrotum or perineum to suggest necrotic infection Other: No free air Musculoskeletal: No acute osseous abnormality Review of the MIP images  confirms the above findings. IMPRESSION: 1. Positive for small volume acute bilateral pulmonary emboli. Slightly elevated RV LV ratio 0.97. 2. Status post prostatectomy. Mild stranding between the bladder and seminal vesicles and surrounding the seminal vesicles, question postsurgical change versus infection. Slightly thick-walled urinary bladder with perivesical stranding and mucosal enhancement, suspicious for cystitis. Mild distal urothelial thickening and stranding, possible ascending urinary tract infection. No evidence for rim enhancing abscess at the surgical bed 3. Large bilateral scrotal hydroceles and suspected diffuse scrotal thickening. No soft tissue gas at the scrotum or perineum to suggest necrotic infection. Consider correlation with scrotal ultrasound 4. Aortic atherosclerosis. Critical Value/emergent results were called by telephone at the time of interpretation on 11/19/2023 at 7:06 Pm to provider [facility] , who verbally acknowledged these results. Electronically Signed   By: Jasmine Pang M.D.   On: 11/19/2023 20:09   VAS [place] LOWER EXTREMITY VENOUS  (DVT) (7a-7p) Result Date: 11/19/2023  Lower Venous DVT Study Patient Name:  Billy [name]  Date of Exam:   11/19/2023 Medical Rec #: [id number]            Accession #:    [id number] Date of Birth: 1961/06/19             Patient Gender: M Patient Age:   63 years Exam Location:  [facility] Procedure:      VAS [place] LOWER EXTREMITY VENOUS (DVT) Referring Phys: Asher Muir BARRETT --------------------------------------------------------------------------------  Indications: Pain.  Risk Factors: Recent prostate surgery and hernia repair (10/25/2023). Comparison Study: No previous exams Performing Technologist: Jody Hill RVT, RDMS  Examination Guidelines: A complete evaluation includes B-mode imaging, spectral Doppler, color Doppler, and power Doppler as needed of all accessible portions of each vessel. Bilateral testing is considered an integral part of a complete examination. Limited examinations for reoccurring indications may be performed as noted. The reflux portion of the exam is performed with the patient in reverse Trendelenburg.  +---------+---------------+---------+-----------+---------------+--------------+ RIGHT    CompressibilityPhasicitySpontaneityProperties     Thrombus Aging +---------+---------------+---------+-----------+---------------+--------------+ CFV      Full                                                             +---------+---------------+---------+-----------+---------------+--------------+ SFJ      Full                                                             +---------+---------------+---------+-----------+---------------+--------------+ FV Prox  Full                                                             +---------+---------------+---------+-----------+---------------+--------------+ FV Mid   Full                                                             +---------+---------------+---------+-----------+---------------+--------------+  FV  DistalFull                                                             +---------+---------------+---------+-----------+---------------+--------------+ PFV      Full                                                             +---------+---------------+---------+-----------+---------------+--------------+ POP      Full                                                             +---------+---------------+---------+-----------+---------------+--------------+ PTV      Full                                                             +---------+---------------+---------+-----------+---------------+--------------+ PERO                                                       Not well                                                                  visualized     +---------+---------------+---------+-----------+---------------+--------------+ GSV      None           No       No         mid calf to    Acute                                                      ankle                         +---------+---------------+---------+-----------+---------------+--------------+ VV's     None           No       No         Distal medial  Acute  thigh & mid                                                               calf                          +---------+---------------+---------+-----------+---------------+--------------+   +----+---------------+---------+-----------+----------+--------------+ LEFTCompressibilityPhasicitySpontaneityPropertiesThrombus Aging +----+---------------+---------+-----------+----------+--------------+ CFV Full           Yes      Yes                                 +----+---------------+---------+-----------+----------+--------------+     Summary: RIGHT: - Findings consistent with acute superficial vein thrombosis involving the right great saphenous vein, and right  varicosities or other superficial veins.  - There is no evidence of deep vein thrombosis in the lower extremity.  - No cystic structure found in the popliteal fossa.  LEFT: - No evidence of common femoral vein obstruction.   *See table(s) above for measurements and observations. Electronically signed by Lemar Livings MD on 11/19/2023 at 7:46:54 PM.    Final    DG Chest 2 View Result Date: 11/19/2023 CLINICAL DATA:  Shortness of breath and cough, recent prostate surgery 1 week ago, fever last night EXAM: CHEST - 2 VIEW COMPARISON:  04/06/2019 FINDINGS: The heart size and mediastinal contours are within normal limits. Similar decreased lung volumes. Both lungs are clear. Negative for edema, effusion or pneumothorax. Trachea midline. The visualized skeletal structures are unremarkable. IMPRESSION: Low volume exam without acute process. Electronically Signed   By: Judie Petit.  Shick M.D.   On: 11/19/2023 16:31    Scheduled Meds:  apixaban  10 mg Oral BID   Followed by   Melene Muller ON 11/27/2023] apixaban  5 mg Oral BID   bictegravir-emtricitabine-tenofovir AF  1 tablet Oral Daily   levofloxacin  500 mg Oral Daily   polyethylene glycol  34 g Oral Daily   sodium chloride flush  3 mL Intravenous Q12H   Continuous Infusions:   LOS: 2 days   Time spent: 45 minutes  Carollee Herter, DO  [facility]  11/21/2023, 10:21 AM

## 2023-11-21 NOTE — Progress Notes (Addendum)
 Subjective: First time meeting Billy [name].  He was resting on morning rounds but easily awoken.  He reports that his pain is diminished since initiation of antibiotics.  No acute events overnight.  Objective: Vital signs in last 24 hours: Temp:  [97.8 F (36.6 C)-99.4 F (37.4 C)] 98.5 F (36.9 C) (04/10 0604) Pulse Rate:  [62-77] 62 (04/10 0604) Resp:  [16-22] 16 (04/10 0604) BP: (113-129)/(70-83) 117/82 (04/10 0604) SpO2:  [99 %-100 %] 100 % (04/10 0604)  Assessment/Plan: # Bilateral epididymoorchitis No signs of systemic infection.  Agree with Levaquin for 10 -14 days.   # AKI Encourage hydration.  May benefit from IV fluid Trend labs  # UTI Pending culture data. Urinalysis nitrite positive but likely color contaminated.  Rare bacteria noted.  If infection is present then this will be covered by the Levaquin.  Okay to discharge from urologic perspective once medically cleared.  Intake/Output from previous day: 04/09 0701 - 04/10 0700 In: 743 [P.O.:660; IV Piggyback:83] Out: 1825 [Urine:1825]  Intake/Output this shift: Total I/O In: 0  Out: 300 [Urine:300]  Physical Exam:  General: Alert and oriented CV: No cyanosis Lungs: equal chest rise Abdomen: Soft, NTND, no rebound or guarding Gu: Bilateral testicular swelling and sensitivity, L>R  Lab Results: Recent Labs    11/19/23 1200 11/20/23 0428 11/21/23 0429  HGB 14.0 12.1* 12.5*  HCT 40.4 35.9* 36.2*   BMET Recent Labs    11/20/23 0540 11/21/23 0429  NA 132* 136  K 3.7 4.0  CL 100 103  CO2 23 25  GLUCOSE 102* 108*  BUN 16 15  CREATININE 1.31* 1.53*  CALCIUM 9.8 10.0     Studies/Results: ECHOCARDIOGRAM COMPLETE Result Date: 11/20/2023    ECHOCARDIOGRAM REPORT   Patient Name:   Billy [name] Date of Exam: 11/20/2023 Medical Rec #:  [id number]           Height:       74.0 in Accession #:    [id number]          Weight:       200.6 lb Date of Birth:  Jan 03, 1961            BSA:           2.176 m Patient Age:    63 years            BP:           104/68 mmHg Patient Gender: M                   HR:           66 bpm. Exam Location:  Inpatient Procedure: 2D Echo, Cardiac Doppler and Color Doppler (Both Spectral and Color            Flow Doppler were utilized during procedure). Indications:    I26.02 Pulmonary embolus  History:        Patient has no prior history of Echocardiogram examinations.                 Signs/Symptoms:Shortness of Breath, Chest Pain and Dyspnea.  Sonographer:    Sheralyn Boatman RDCS Referring Phys: [id number] [facility] GOEL IMPRESSIONS  1. Left ventricular ejection fraction, by estimation, is 50 to 55%. The left ventricle has low normal function. The left ventricle demonstrates regional wall motion abnormalities (see scoring diagram/findings for description). Left ventricular diastolic  parameters are consistent with Grade I diastolic dysfunction (impaired relaxation). There is mild hypokinesis of the  left ventricular, basal-mid inferolateral wall.  2. Right ventricular systolic function is normal. The right ventricular size is normal.  3. The mitral valve is normal in structure. Trivial mitral valve regurgitation. No evidence of mitral stenosis.  4. The aortic valve is tricuspid. Aortic valve regurgitation is not visualized. No aortic stenosis is present.  5. The inferior vena cava is dilated in size with >50% respiratory variability, suggesting right atrial pressure of 8 mmHg.  6. Cannot exclude a small PFO. Comparison(s): No prior Echocardiogram. Conclusion(s)/Recommendation(s): Otherwise normal echocardiogram, with minor abnormalities described in the report. FINDINGS  Left Ventricle: Left ventricular ejection fraction, by estimation, is 50 to 55%. The left ventricle has low normal function. The left ventricle demonstrates regional wall motion abnormalities. Mild hypokinesis of the left ventricular, basal-mid inferolateral wall. The left ventricular internal cavity size was normal in size.  There is no left ventricular hypertrophy. Left ventricular diastolic parameters are consistent with Grade I diastolic dysfunction (impaired relaxation). Right Ventricle: The right ventricular size is normal. No increase in right ventricular wall thickness. Right ventricular systolic function is normal. Left Atrium: Left atrial size was normal in size. Right Atrium: Right atrial size was normal in size. Pericardium: There is no evidence of pericardial effusion. Mitral Valve: Redundant chordae noted. The mitral valve is normal in structure. Trivial mitral valve regurgitation. No evidence of mitral valve stenosis. Tricuspid Valve: The tricuspid valve is normal in structure. Tricuspid valve regurgitation is trivial. No evidence of tricuspid stenosis. Aortic Valve: The aortic valve is tricuspid. Aortic valve regurgitation is not visualized. No aortic stenosis is present. Pulmonic Valve: The pulmonic valve was not well visualized. Pulmonic valve regurgitation is not visualized. No evidence of pulmonic stenosis. Aorta: The aortic root and ascending aorta are structurally normal, with no evidence of dilitation. Venous: The inferior vena cava is dilated in size with greater than 50% respiratory variability, suggesting right atrial pressure of 8 mmHg. IAS/Shunts: Cannot exclude a small PFO.  LEFT VENTRICLE PLAX 2D LVIDd:         5.20 cm      Diastology LVIDs:         4.00 cm      LV e' medial:    6.09 cm/s LV PW:         1.10 cm      LV E/e' medial:  8.1 LV IVS:        1.20 cm      LV e' lateral:   10.10 cm/s LVOT diam:     2.70 cm      LV E/e' lateral: 4.9 LV SV:         86 LV SV Index:   40 LVOT Area:     5.73 cm  LV Volumes (MOD) LV vol d, MOD A2C: 113.0 ml LV vol d, MOD A4C: 70.2 ml LV vol s, MOD A2C: 50.2 ml LV vol s, MOD A4C: 39.5 ml LV SV MOD A2C:     62.8 ml LV SV MOD A4C:     70.2 ml LV SV MOD BP:      47.3 ml RIGHT VENTRICLE             IVC RV S prime:     18.10 cm/s  IVC diam: 2.20 cm TAPSE (M-mode): 1.9 cm LEFT  ATRIUM             Index        RIGHT ATRIUM           Index  LA diam:        2.90 cm 1.33 cm/m   RA Area:     11.90 cm LA Vol (A2C):   23.2 ml 10.66 ml/m  RA Volume:   22.70 ml  10.43 ml/m LA Vol (A4C):   46.5 ml 21.37 ml/m LA Biplane Vol: 35.0 ml 16.08 ml/m  AORTIC VALVE LVOT Vmax:   79.20 cm/s LVOT Vmean:  50.100 cm/s LVOT VTI:    0.151 m  AORTA Ao Root diam: 3.50 cm Ao Asc diam:  3.30 cm MITRAL VALVE MV Area (PHT): 3.17 cm    SHUNTS MV Decel Time: 239 msec    Systemic VTI:  0.15 m MV E velocity: 49.40 cm/s  Systemic Diam: 2.70 cm MV A velocity: 44.70 cm/s MV E/A ratio:  1.11 Jodelle Red MD Electronically signed by Jodelle Red MD Signature Date/Time: 11/20/2023/1:22:07 PM    Final    US SCROTUM W/DOPPLER Result Date: 11/19/2023 CLINICAL DATA:  Scrotal pain and swelling EXAM: SCROTAL ULTRASOUND DOPPLER ULTRASOUND OF THE TESTICLES TECHNIQUE: Complete ultrasound examination of the testicles, epididymis, and other scrotal structures was performed. Color and spectral Doppler ultrasound were also utilized to evaluate blood flow to the testicles. COMPARISON:  CT 11/19/2023 FINDINGS: Right testicle Measurements: 4.6 x 2.6 x 3 cm. No mass or microlithiasis visualized. Left testicle Measurements: 4 x 1.9 x 2.6 cm. No mass or microlithiasis visualized. Right epididymis: Enlarged heterogeneous and hypervascular. Small cyst measuring 3 mm Left epididymis:  Enlarged heterogeneous and hypervascular. Hydrocele: Small bilateral hydroceles containing particulate debris. Varicocele:  None visualized. Pulsed Doppler interrogation of both testes demonstrates normal low resistance arterial and venous waveforms bilaterally. Diffuse scrotal wall thickening. IMPRESSION: 1. Negative for testicular torsion or mass. 2. Enlarged heterogeneous and hypervascular epididymides bilaterally suggestive of epididymitis. 3. Small bilateral hydroceles containing particulate debris. 4. Diffuse scrotal wall thickening.  Electronically Signed   By: Jasmine Pang M.D.   On: 11/19/2023 23:31   CT ABDOMEN PELVIS W CONTRAST Result Date: 11/19/2023 CLINICAL DATA:  High probability PE, recent surgery on prostate EXAM: CT ANGIOGRAPHY CHEST CT ABDOMEN AND PELVIS WITH CONTRAST TECHNIQUE: Multidetector CT imaging of the chest was performed using the standard protocol during bolus administration of intravenous contrast. Multiplanar CT image reconstructions and MIPs were obtained to evaluate the vascular anatomy. Multidetector CT imaging of the abdomen and pelvis was performed using the standard protocol during bolus administration of intravenous contrast. RADIATION DOSE REDUCTION: This exam was performed according to the departmental dose-optimization program which includes automated exposure control, adjustment of the mA and/or kV according to patient size and/or use of iterative reconstruction technique. CONTRAST:  OMNIPAQUE IOHEXOL 350 MG/ML SOLN COMPARISON:  MRI 11/03/2021 FINDINGS: CTA CHEST FINDINGS Cardiovascular: Satisfactory opacification of the pulmonary arteries to the segmental level. No evidence of pulmonary embolism. Positive for small volume acute bilateral pulmonary emboli. Small volume thrombus within distal left upper pulmonary artery with multiple small segmental and subsegmental left upper lobe pulmonary emboli. Small volume right upper lobe segmental and subsegmental and right lower lobe subsegmental pulmonary emboli. RV LV ratio 0.97. Nonaneurysmal aorta. Normal cardiac size. No pericardial effusion Mediastinum/Nodes: Patent trachea. No thyroid mass. No suspicious lymph nodes. Esophagus within normal limits. Lungs/Pleura: No pleural effusion or pneumothorax. Subsegmental atelectasis in the right upper lobe. Musculoskeletal: No acute osseous abnormality. Review of the MIP images confirms the above findings. CT ABDOMEN and PELVIS FINDINGS Hepatobiliary: Subcentimeter hypodensity in the liver too small to further  characterize. No calcified gallstone or biliary dilatation Pancreas:  Unremarkable. No pancreatic ductal dilatation or surrounding inflammatory changes. Spleen: Normal in size without focal abnormality. Adrenals/Urinary Tract: Adrenal glands are normal. Fullness of the renal collecting systems without obstructing stone. Mild distal urothelial thickening and stranding. Slightly thick-walled urinary bladder with perivesical stranding and mucosal enhancement. Stomach/Bowel: Stomach is within normal limits. Appendix appears normal. No evidence of bowel wall thickening, distention, or inflammatory changes. Vascular/Lymphatic: Aortic atherosclerosis. No enlarged abdominal or pelvic lymph nodes. Reproductive: Status post prostatectomy. Mild stranding between the bladder and seminal vesicles and surrounding the seminal vesicles. Large bilateral scrotal hydroceles and suspected diffuse scrotal thickening. No soft tissue gas at the scrotum or perineum to suggest necrotic infection Other: No free air Musculoskeletal: No acute osseous abnormality Review of the MIP images confirms the above findings. IMPRESSION: 1. Positive for small volume acute bilateral pulmonary emboli. Slightly elevated RV LV ratio 0.97. 2. Status post prostatectomy. Mild stranding between the bladder and seminal vesicles and surrounding the seminal vesicles, question postsurgical change versus infection. Slightly thick-walled urinary bladder with perivesical stranding and mucosal enhancement, suspicious for cystitis. Mild distal urothelial thickening and stranding, possible ascending urinary tract infection. No evidence for rim enhancing abscess at the surgical bed 3. Large bilateral scrotal hydroceles and suspected diffuse scrotal thickening. No soft tissue gas at the scrotum or perineum to suggest necrotic infection. Consider correlation with scrotal ultrasound 4. Aortic atherosclerosis. Critical Value/emergent results were called by telephone at the time  of interpretation on 11/19/2023 at 7:06 Pm to provider [facility] , who verbally acknowledged these results. Electronically Signed   By: Jasmine Pang M.D.   On: 11/19/2023 20:09   CT Angio Chest Pulmonary Embolism (PE) W or WO Contrast Result Date: 11/19/2023 CLINICAL DATA:  High probability PE, recent surgery on prostate EXAM: CT ANGIOGRAPHY CHEST CT ABDOMEN AND PELVIS WITH CONTRAST TECHNIQUE: Multidetector CT imaging of the chest was performed using the standard protocol during bolus administration of intravenous contrast. Multiplanar CT image reconstructions and MIPs were obtained to evaluate the vascular anatomy. Multidetector CT imaging of the abdomen and pelvis was performed using the standard protocol during bolus administration of intravenous contrast. RADIATION DOSE REDUCTION: This exam was performed according to the departmental dose-optimization program which includes automated exposure control, adjustment of the mA and/or kV according to patient size and/or use of iterative reconstruction technique. CONTRAST:  OMNIPAQUE IOHEXOL 350 MG/ML SOLN COMPARISON:  MRI 11/03/2021 FINDINGS: CTA CHEST FINDINGS Cardiovascular: Satisfactory opacification of the pulmonary arteries to the segmental level. No evidence of pulmonary embolism. Positive for small volume acute bilateral pulmonary emboli. Small volume thrombus within distal left upper pulmonary artery with multiple small segmental and subsegmental left upper lobe pulmonary emboli. Small volume right upper lobe segmental and subsegmental and right lower lobe subsegmental pulmonary emboli. RV LV ratio 0.97. Nonaneurysmal aorta. Normal cardiac size. No pericardial effusion Mediastinum/Nodes: Patent trachea. No thyroid mass. No suspicious lymph nodes. Esophagus within normal limits. Lungs/Pleura: No pleural effusion or pneumothorax. Subsegmental atelectasis in the right upper lobe. Musculoskeletal: No acute osseous abnormality. Review of the MIP images  confirms the above findings. CT ABDOMEN and PELVIS FINDINGS Hepatobiliary: Subcentimeter hypodensity in the liver too small to further characterize. No calcified gallstone or biliary dilatation Pancreas: Unremarkable. No pancreatic ductal dilatation or surrounding inflammatory changes. Spleen: Normal in size without focal abnormality. Adrenals/Urinary Tract: Adrenal glands are normal. Fullness of the renal collecting systems without obstructing stone. Mild distal urothelial thickening and stranding. Slightly thick-walled urinary bladder with perivesical stranding and mucosal enhancement. Stomach/Bowel: Stomach  is within normal limits. Appendix appears normal. No evidence of bowel wall thickening, distention, or inflammatory changes. Vascular/Lymphatic: Aortic atherosclerosis. No enlarged abdominal or pelvic lymph nodes. Reproductive: Status post prostatectomy. Mild stranding between the bladder and seminal vesicles and surrounding the seminal vesicles. Large bilateral scrotal hydroceles and suspected diffuse scrotal thickening. No soft tissue gas at the scrotum or perineum to suggest necrotic infection Other: No free air Musculoskeletal: No acute osseous abnormality Review of the MIP images confirms the above findings. IMPRESSION: 1. Positive for small volume acute bilateral pulmonary emboli. Slightly elevated RV LV ratio 0.97. 2. Status post prostatectomy. Mild stranding between the bladder and seminal vesicles and surrounding the seminal vesicles, question postsurgical change versus infection. Slightly thick-walled urinary bladder with perivesical stranding and mucosal enhancement, suspicious for cystitis. Mild distal urothelial thickening and stranding, possible ascending urinary tract infection. No evidence for rim enhancing abscess at the surgical bed 3. Large bilateral scrotal hydroceles and suspected diffuse scrotal thickening. No soft tissue gas at the scrotum or perineum to suggest necrotic infection.  Consider correlation with scrotal ultrasound 4. Aortic atherosclerosis. Critical Value/emergent results were called by telephone at the time of interpretation on 11/19/2023 at 7:06 Pm to provider [facility] , who verbally acknowledged these results. Electronically Signed   By: Jasmine Pang M.D.   On: 11/19/2023 20:09   VAS [place] LOWER EXTREMITY VENOUS (DVT) (7a-7p) Result Date: 11/19/2023  Lower Venous DVT Study Patient Name:  Billy [name]  Date of Exam:   11/19/2023 Medical Rec #: [id number]            Accession #:    [id number] Date of Birth: 09-Apr-1961             Patient Gender: M Patient Age:   39 years Exam Location:  [facility] Procedure:      VAS [place] LOWER EXTREMITY VENOUS (DVT) Referring Phys: Asher Muir BARRETT --------------------------------------------------------------------------------  Indications: Pain.  Risk Factors: Recent prostate surgery and hernia repair (10/25/2023). Comparison Study: No previous exams Performing Technologist: Jody Hill RVT, RDMS  Examination Guidelines: A complete evaluation includes B-mode imaging, spectral Doppler, color Doppler, and power Doppler as needed of all accessible portions of each vessel. Bilateral testing is considered an integral part of a complete examination. Limited examinations for reoccurring indications may be performed as noted. The reflux portion of the exam is performed with the patient in reverse Trendelenburg.  +---------+---------------+---------+-----------+---------------+--------------+ RIGHT    CompressibilityPhasicitySpontaneityProperties     Thrombus Aging +---------+---------------+---------+-----------+---------------+--------------+ CFV      Full                                                             +---------+---------------+---------+-----------+---------------+--------------+ SFJ      Full                                                              +---------+---------------+---------+-----------+---------------+--------------+ FV Prox  Full                                                             +---------+---------------+---------+-----------+---------------+--------------+  FV Mid   Full                                                             +---------+---------------+---------+-----------+---------------+--------------+ FV DistalFull                                                             +---------+---------------+---------+-----------+---------------+--------------+ PFV      Full                                                             +---------+---------------+---------+-----------+---------------+--------------+ POP      Full                                                             +---------+---------------+---------+-----------+---------------+--------------+ PTV      Full                                                             +---------+---------------+---------+-----------+---------------+--------------+ PERO                                                       Not well                                                                  visualized     +---------+---------------+---------+-----------+---------------+--------------+ GSV      None           No       No         mid calf to    Acute                                                      ankle                         +---------+---------------+---------+-----------+---------------+--------------+ VV's     None           No  No         Distal medial  Acute                                                      thigh & mid                                                               calf                          +---------+---------------+---------+-----------+---------------+--------------+   +----+---------------+---------+-----------+----------+--------------+  LEFTCompressibilityPhasicitySpontaneityPropertiesThrombus Aging +----+---------------+---------+-----------+----------+--------------+ CFV Full           Yes      Yes                                 +----+---------------+---------+-----------+----------+--------------+     Summary: RIGHT: - Findings consistent with acute superficial vein thrombosis involving the right great saphenous vein, and right varicosities or other superficial veins.  - There is no evidence of deep vein thrombosis in the lower extremity.  - No cystic structure found in the popliteal fossa.  LEFT: - No evidence of common femoral vein obstruction.   *See table(s) above for measurements and observations. Electronically signed by Lemar Livings MD on 11/19/2023 at 7:46:54 PM.    Final    DG Chest 2 View Result Date: 11/19/2023 CLINICAL DATA:  Shortness of breath and cough, recent prostate surgery 1 week ago, fever last night EXAM: CHEST - 2 VIEW COMPARISON:  04/06/2019 FINDINGS: The heart size and mediastinal contours are within normal limits. Similar decreased lung volumes. Both lungs are clear. Negative for edema, effusion or pneumothorax. Trachea midline. The visualized skeletal structures are unremarkable. IMPRESSION: Low volume exam without acute process. Electronically Signed   By: Judie Petit.  Shick M.D.   On: 11/19/2023 16:31      LOS: 2 days   Elmon Kirschner, NP [facility] Pager: [phone]  11/21/2023, 8:21 AM    I have seen and examined the patient and agree with Cameron's plan.   S:  Persistant by improving scrotal pain. Ambulating w/o SOB.  O: NAD, family at bedsdie Non-labored breathign on RA RRR Recent incisions c/d/I Bilateral testis firmness / sensitivity w/o fluctuence / absecess / crepitus / skin changs No c/c/e  UCX e. Coli sens FQ, Rec ancef and bactrim (what he reieved peri-op).  A/P:  Agree with levaquin x 10-14 days total as orchits can be stubborn.  Greatly  appreciate hospitalaist team comanagmenet.   I feel he is OK for DC as soon as tomorrow from [employer], he has FU with [place] pending.

## 2023-11-22 DIAGNOSIS — N390 Urinary tract infection, site not specified: Secondary | ICD-10-CM

## 2023-11-22 DIAGNOSIS — B9620 Unspecified Escherichia coli [E. coli] as the cause of diseases classified elsewhere: Secondary | ICD-10-CM

## 2023-11-22 DIAGNOSIS — N452 Orchitis: Secondary | ICD-10-CM | POA: Diagnosis not present

## 2023-11-22 DIAGNOSIS — I2699 Other pulmonary embolism without acute cor pulmonale: Secondary | ICD-10-CM | POA: Diagnosis not present

## 2023-11-22 DIAGNOSIS — I82890 Acute embolism and thrombosis of other specified veins: Secondary | ICD-10-CM | POA: Diagnosis not present

## 2023-11-22 LAB — CBC WITH DIFFERENTIAL/PLATELET
Abs Immature Granulocytes: 0.04 10*3/uL (ref 0.00–0.07)
Basophils Absolute: 0.1 10*3/uL (ref 0.0–0.1)
Basophils Relative: 1 %
Eosinophils Absolute: 0.9 10*3/uL — ABNORMAL HIGH (ref 0.0–0.5)
Eosinophils Relative: 12 %
HCT: 36.8 % — ABNORMAL LOW (ref 39.0–52.0)
Hemoglobin: 12.4 g/dL — ABNORMAL LOW (ref 13.0–17.0)
Immature Granulocytes: 1 %
Lymphocytes Relative: 28 %
Lymphs Abs: 2.2 10*3/uL (ref 0.7–4.0)
MCH: 30.3 pg (ref 26.0–34.0)
MCHC: 33.7 g/dL (ref 30.0–36.0)
MCV: 90 fL (ref 80.0–100.0)
Monocytes Absolute: 0.8 10*3/uL (ref 0.1–1.0)
Monocytes Relative: 10 %
Neutro Abs: 3.9 10*3/uL (ref 1.7–7.7)
Neutrophils Relative %: 48 %
Platelets: 258 10*3/uL (ref 150–400)
RBC: 4.09 MIL/uL — ABNORMAL LOW (ref 4.22–5.81)
RDW: 13.4 % (ref 11.5–15.5)
Smear Review: NORMAL
WBC: 7.9 10*3/uL (ref 4.0–10.5)
nRBC: 0 % (ref 0.0–0.2)

## 2023-11-22 LAB — BASIC METABOLIC PANEL WITH GFR
Anion gap: 8 (ref 5–15)
BUN: 19 mg/dL (ref 8–23)
CO2: 26 mmol/L (ref 22–32)
Calcium: 10.1 mg/dL (ref 8.9–10.3)
Chloride: 102 mmol/L (ref 98–111)
Creatinine, Ser: 1.58 mg/dL — ABNORMAL HIGH (ref 0.61–1.24)
GFR, Estimated: 49 mL/min — ABNORMAL LOW (ref >60)
Glucose, Bld: 97 mg/dL (ref 70–99)
Potassium: 4.1 mmol/L (ref 3.5–5.1)
Sodium: 136 mmol/L (ref 135–145)

## 2023-11-22 MED ORDER — LEVOFLOXACIN 500 MG PO TABS: 500.0000 mg | ORAL_TABLET | Freq: Every day | ORAL | 0 refills | Status: AC

## 2023-11-22 MED ORDER — APIXABAN (ELIQUIS) VTE STARTER PACK (10MG AND 5MG): ORAL_TABLET | ORAL | 0 refills | Status: DC

## 2023-11-22 NOTE — Discharge Summary (Signed)
 [facility] Physician Discharge Summary   Patient name: Billy [name]  Admit date:     11/19/2023  Discharge date: 11/22/2023  Attending Physician: Nolberto Hanlon [[id number]]  Discharge Physician: Carollee Herter   PCP: Montez Hageman, DO  Admitted From: Home  Disposition:  Home  Recommendations for Outpatient Follow-up:  Follow up with PCP in 1-2 weeks Follow up with Dr. Berneice Heinrich with urology in 1-2 weeks  Home Health:No Equipment/Devices: None    Discharge Condition:Stable CODE STATUS:FULL Diet recommendation: Regular Fluid Restriction: None  Hospital Summary: HPI: Billy [name] is a 63 y.o. male with medical history significant of BPH LUTS s.p "simple prostatectomy" on 10/25/2023 robot assisted. Umbilical hernia repair.   Since then patinet reports some scrotal pain. Patient has recived outpatient doxycycline for same.   Paitnet returned to ER today due to several new symptoms : patinet apparently had a fever last night and difficulty with urination flow. No abd pain. Paitnet has had persistent minimal hematuria sicne the time of his surgery. No diarrhea, no vomting. Patient also reports new onset cough, sob and chest pain pleuritic since this AM . No expectoration. Chest pain only with cuohging. Sob with couhging. No leg swelling, no rash on skin. He also reports right lower extremity discomfort medial thigh distally.  Significant Events: Admitted 11/19/2023 for acute PE   Significant Labs: WBC 9.3, HgB 14, plt 259 UA, turbid, HgB mod, Nitrite Positive, LE large, WBC >50 Na 135, K 3.8, CO2 of 25, BUN 16, scr 1.65  Significant Imaging Studies: CXR Low volume exam without acute process.  CTPA/CT abd/pelvis Positive for small volume acute bilateral pulmonary emboli. Slightly elevated RV LV ratio 0.97. 2. Status post prostatectomy. Mild stranding between the bladder and seminal vesicles and  surrounding the seminal vesicles, question postsurgical change versus  infection. Slightly thick-walled urinary bladder with perivesical stranding and mucosal enhancement, suspicious for cystitis. Mild distal urothelial thickening and stranding, possible ascending urinary tract infection. No evidence  for rim enhancing abscess at the surgical bed 3. Large bilateral scrotal hydroceles and suspected diffuse scrotal thickening. No soft tissue gas at the scrotum or perineum to suggest necrotic infection. Consider correlation with scrotal ultrasound 4. Aortic atherosclerosis. Scrotal U/S Negative for testicular torsion or mass. 2. Enlarged heterogeneous and hypervascular epididymides bilaterally suggestive of epididymitis. 3. Small bilateral hydroceles containing particulate debris. 4. Diffuse scrotal wall thickening  Antibiotic Therapy: Anti-infectives (From admission, onward)    Start     Dose/Rate Route Frequency Ordered Stop   11/20/23 1700  vancomycin (VANCOREADY) IVPB 1500 mg/300 mL       Placed in "Followed by" Linked Group   1,500 mg 150 mL/hr over 120 Minutes Intravenous Every 24 hours 11/19/23 1618     11/20/23 1000  bictegravir-emtricitabine-tenofovir AF (BIKTARVY) 50-200-25 MG per tablet 1 tablet        1 tablet Oral Daily 11/19/23 2151     11/20/23 0500  cefTRIAXone (ROCEPHIN) 2 g in sodium chloride 0.9 % 100 mL IVPB        2 g 200 mL/hr over 30 Minutes Intravenous  Once 11/19/23 2138 11/20/23 0556   11/19/23 2200  levofloxacin (LEVAQUIN) IVPB 500 mg        500 mg 100 mL/hr over 60 Minutes Intravenous Every 24 hours 11/19/23 2138 11/29/23 2159   11/19/23 1700  vancomycin (VANCOREADY) IVPB 2000 mg/400 mL       Placed in "Followed by" Linked Group   2,000 mg 200 mL/hr over 120 Minutes Intravenous  Once 11/19/23 1618 11/19/23 2033   11/19/23 1630  ceFEPIme (MAXIPIME) 2 g in sodium chloride 0.9 % 100 mL IVPB  Status:  Discontinued        2 g 200 mL/hr over 30 Minutes Intravenous Every 12 hours 11/19/23 1618 11/19/23 2138        Procedures:   Consultants: urology   Hospital Course by Problem: * Acute pulmonary embolism (HCC) On admission. Heparin infusion started. I do not see indication for thrombolysis at thsi time. No cor pulmonale. Check echo.  11-20-2023 echo negative for RV dysfunction.  He only has superficial thrombophlebitis on his lower extremity ultrasounds.  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  Stop IV heparin.  Change over to p.o. Eliquis.  11-21-2023 tolerating Eliquis.  On room air.  11-22-2023 discharged to home on Eliquis.  He will need least 6 months of therapy.  He will need to see his PCP in the office in 1 to 2 weeks to get a refill on his Eliquis.  Superficial vein thrombosis On admission. On right with  pulm embolism. Started on heaprin infusion - c.w. same. Patient advised to report any new symptoms headche, skin bruising, severe back pain to staff promptly.  11-20-2023 LE U/S negative for DVT. Has superficial thrombophlebitis. But with his acute PE he most likely has acute LE DVT. Change IV heparin to po Eliquis.  11-21-2023 continue with p.o. Eliquis.  He will need 6 months of therapy due to his PE.  E. coli UTI On admission. See on imaging . Check urine culture. Abx as above.  11-20-2023 change abx to levaquin. Discussed with urology. No operative intervention needed.   11-21-2023  Urine culture growing gram-negative rods.  Awaiting final ID.  On Levaquin.  11-22-2023 Urine cx grew E. Coli sensitive to cipro. Pt has been on levaquin. Less left testicle pain.  Discharged home on Levaquin.  Orchitis On admission. Left. S.p vanco+cefepime in the ER. Give one dose of ceftriaxone and rx with 10 days of levoflxacin. GC probe pending for urine. I found the left testicle pretty hard. Look forward to Dr. Margo Aye (urology eval in this regard)  11-20-2023 change abx to levaquin. Discussed with urology. No operative intervention needed.   11-21-2023 changed to p.o. Levaquin.   Awaiting urine culture.  Gonorrhea and Chlamydia probe are negative.  11-22-2023 urine culture grew E. coli.  Discharged to home on Levaquin.  Urology wanted 14 days of therapy.  Will discharge him on additional 11 days of Levaquin.  Hypercalcemia On admission Chronic. Will send work up in AM  11-20-2023 vitamin D level is now. PTH pending  11-21-2023 resolved   11-22-2023 discharge calcium of 10.1  CKD stage 3a, GFR 45-59 ml/min (HCC) - baseline scR 1.3-1.5 11-20-2023 Baseline scr 1.3-1.5  11-21-2023 stable  11-22-2023 discharge creatinine 1.58, BUN of 19  Human immunodeficiency virus (HIV) disease (HCC) 11-20-2023 continue with [facility]  11-21-2023 continue Biktarvy.  CD4 count is normal at 593.  11-22-2023 Continue Biktarvy at home.  CONSTIPATION, CHRONIC 11-21-2023 Dulcolax 10 mg p.o. and 34 mg of MiraLAX give him a bowel movement yesterday.  He is very happy about this.  11-22-2023 stable.    Discharge Diagnoses:  Principal Problem:   Acute pulmonary embolism (HCC) Active Problems:   Orchitis   E. coli UTI   Superficial vein thrombosis   Human immunodeficiency virus (HIV) disease (HCC)   CKD stage 3a, GFR 45-59 ml/min (HCC) - baseline scR 1.3-1.5   Hypercalcemia  CONSTIPATION, CHRONIC   Discharge Instructions  Discharge Instructions     Ambulatory referral to Cardiology   Complete by: As directed    Left ventricular Wall motion abnormality on echo   Call MD for:  difficulty breathing, headache or visual disturbances   Complete by: As directed    Call MD for:  extreme fatigue   Complete by: As directed    Call MD for:  hives   Complete by: As directed    Call MD for:  persistant dizziness or light-headedness   Complete by: As directed    Call MD for:  persistant nausea and vomiting   Complete by: As directed    Call MD for:  redness, tenderness, or signs of infection (pain, swelling, redness, odor or green/yellow discharge around incision site)    Complete by: As directed    Call MD for:  severe uncontrolled pain   Complete by: As directed    Call MD for:  temperature >100.4   Complete by: As directed    Diet - low sodium heart healthy   Complete by: As directed    Discharge instructions   Complete by: As directed    1. Follow up with your primary care provider in 1-2 weeks following discharge from hospital. 2. Follow up with Dr. Berneice Heinrich with urology in 1-2 weeks. You will need to call for appointment.   Increase activity slowly   Complete by: As directed       Allergies as of 11/22/2023   No Known Allergies      Medication List     STOP taking these medications    ciprofloxacin 500 MG tablet Commonly known as: CIPRO   docusate sodium 100 MG capsule Commonly known as: COLACE   doxycycline 100 MG tablet Commonly known as: VIBRA-TABS   HYDROcodone-acetaminophen 5-325 MG tablet Commonly known as: NORCO/VICODIN   sulfamethoxazole-trimethoprim 800-160 MG tablet Commonly known as: BACTRIM DS       TAKE these medications    Apixaban Starter Pack (10mg  and 5mg ) Commonly known as: ELIQUIS STARTER PACK Take as directed on package: start with two-5mg  tablets twice daily for 7 days. On day 8, switch to one-5mg  tablet twice daily.   Biktarvy 50-200-25 MG Tabs tablet Generic drug: bictegravir-emtricitabine-tenofovir AF Take 1 tablet by mouth daily.   levofloxacin 500 MG tablet Commonly known as: LEVAQUIN Take 1 tablet (500 mg total) by mouth daily for 11 days.        No Known Allergies  Discharge Exam: Vitals:   11/21/23 2011 11/22/23 0524  BP: 118/71 101/66  Pulse: 93 63  Resp: 17 16  Temp: 98.1 F (36.7 C) 98.3 F (36.8 C)  SpO2: 100% 100%    Physical Exam Vitals and nursing note reviewed.  Constitutional:      General: He is not in acute distress.    Appearance: He is normal weight. He is not ill-appearing, toxic-appearing or diaphoretic.  HENT:     Head: Normocephalic and atraumatic.      Nose: Nose normal.  Cardiovascular:     Rate and Rhythm: Normal rate and regular rhythm.  Pulmonary:     Effort: Pulmonary effort is normal.     Breath sounds: Normal breath sounds.  Abdominal:     General: Abdomen is flat. Bowel sounds are normal. There is no distension.     Palpations: Abdomen is soft.  Genitourinary:    Comments: Left testicle less swollen, minimal pain with palpation. Skin:    General: Skin  is warm and dry.     Capillary Refill: Capillary refill takes less than 2 seconds.  Neurological:     Mental Status: He is alert and oriented to person, place, and time.     The results of significant diagnostics from this hospitalization (including imaging, microbiology, ancillary and laboratory) are listed below for reference.    Microbiology: Recent Results (from the past 240 hours)  Blood culture (routine x 2)     Status: None (Preliminary result)   Collection Time: 11/19/23  5:00 PM   Specimen: BLOOD  Result Value Ref Range Status   Specimen Description   Final    BLOOD BLOOD LEFT ARM Performed at [facility], [address], [place], [place] [address]    Special Requests   Final    BOTTLES DRAWN AEROBIC AND ANAEROBIC Blood Culture adequate volume Performed at [facility], [address] [place], [place] [address]    Culture   Final    NO GROWTH 3 DAYS Performed at [facility], [address] [place], [place] [address]    Report Status PENDING  Incomplete  Blood culture (routine x 2)     Status: None (Preliminary result)   Collection Time: 11/19/23  5:28 PM   Specimen: BLOOD  Result Value Ref Range Status   Specimen Description   Final    BLOOD SITE NOT SPECIFIED Performed at [facility], [address] [place], [place] [address]    Special Requests   Final    BOTTLES DRAWN AEROBIC AND ANAEROBIC Blood Culture adequate volume Performed at [facility], [address] [place], [place] [address]    Culture   Final    NO GROWTH 3 DAYS Performed at [facility], [address] [place], [place] [address]    Report Status PENDING  Incomplete  Culture, Urine (Do not remove urinary catheter, catheter placed by urology or difficult to place)     Status: Abnormal   Collection Time: 11/19/23  9:45 PM   Specimen: Urine, Catheterized  Result Value Ref Range Status   Specimen Description   Final    URINE, CATHETERIZED Performed at [facility], [address], [place], [place] [address]    Special Requests   Final    NONE Performed at [facility], [address], [place], [place] [address]    Culture >=100,000 COLONIES/mL ESCHERICHIA COLI (A)  Final   Report Status 11/21/2023 FINAL  Final   Organism ID, Bacteria ESCHERICHIA COLI (A)  Final      Susceptibility   Escherichia coli - MIC*    AMPICILLIN >=32 RESISTANT Resistant     CEFAZOLIN >=64 RESISTANT Resistant     CEFEPIME <=0.12 SENSITIVE Sensitive     CEFTRIAXONE <=0.25 SENSITIVE Sensitive     CIPROFLOXACIN <=0.25 SENSITIVE Sensitive     GENTAMICIN <=1 SENSITIVE Sensitive     IMIPENEM <=0.25 SENSITIVE Sensitive     NITROFURANTOIN <=16 SENSITIVE Sensitive     TRIMETH/SULFA >=320 RESISTANT Resistant     AMPICILLIN/SULBACTAM >=32 RESISTANT Resistant     PIP/TAZO 64 INTERMEDIATE Intermediate ug/mL    * >=100,000 COLONIES/mL ESCHERICHIA COLI     Labs: BNP (last 3 results) Recent Labs    11/19/23 1700  BNP 54.2   Basic Metabolic Panel: Recent Labs  Lab 11/19/23 1200 11/20/23 0540 11/21/23 0429 11/22/23 0352  NA 135 132* 136 136  K 3.8 3.7 4.0 4.1  CL 99 100 103 102  CO2 25 23 25 26   GLUCOSE 123* 102* 108* 97  BUN 16 16 15 19   CREATININE 1.65* 1.31* 1.53* 1.58*  CALCIUM 10.5* 9.8 10.0 10.1  PHOS  --  3.6  --   --    Liver Function Tests: Recent Labs  Lab 11/19/23 1200  AST 19  ALT 18  ALKPHOS 75  BILITOT 0.8  PROT 7.8  ALBUMIN 3.6    Recent Labs  Lab 11/19/23 1200  LIPASE 22   No results for input(s): "AMMONIA" in the last 168 hours. CBC: Recent Labs  Lab 11/19/23 1200 11/20/23 0428 11/21/23 0429 11/22/23 0352  WBC 9.3 7.6 6.9 7.9  NEUTROABS  --   --  4.3 3.9  HGB 14.0 12.1* 12.5* 12.4*  HCT 40.4 35.9* 36.2* 36.8*  MCV 87.8 89.1 88.5 90.0  PLT 259 194 232 258   Cardiac Enzymes: No results for input(s): "CKTOTAL", "CKMB", "CKMBINDEX", "TROPONINI" in the last 168 hours. BNP: Recent Labs  Lab 11/19/23 1700  BNP 54.2   CBG: No results for input(s): "GLUCAP" in the last 168 hours. D-Dimer No results for input(s): "DDIMER" in the last 72 hours. Hgb A1c No results for input(s): "HGBA1C" in the last 72 hours. Lipid Profile No results for input(s): "CHOL", "HDL", "LDLCALC", "TRIG", "CHOLHDL", "LDLDIRECT" in the last 72 hours. Thyroid function studies No results for input(s): "TSH", "T4TOTAL", "FREET4", "T3FREE", "THYROIDAB" in the last 72 hours.  Invalid input(s): "FREET3" Anemia work up No results for input(s): "VITAMINB12", "FOLATE", "FERRITIN", "TIBC", "IRON", "RETICCTPCT" in the last 72 hours. Urinalysis    Component Value Date/Time   COLORURINE AMBER (A) 11/19/2023 1200   APPEARANCEUR TURBID (A) 11/19/2023 1200   LABSPEC 1.013 11/19/2023 1200   PHURINE 5.0 11/19/2023 1200   GLUCOSEU NEGATIVE 11/19/2023 1200   HGBUR MODERATE (A) 11/19/2023 1200   BILIRUBINUR NEGATIVE 11/19/2023 1200   KETONESUR NEGATIVE 11/19/2023 1200   PROTEINUR 100 (A) 11/19/2023 1200   NITRITE POSITIVE (A) 11/19/2023 1200   LEUKOCYTESUR LARGE (A) 11/19/2023 1200   Sepsis Labs Recent Labs  Lab 11/19/23 1200 11/20/23 0428 11/21/23 0429 11/22/23 0352  WBC 9.3 7.6 6.9 7.9    Procedures/Studies: ECHOCARDIOGRAM COMPLETE Result Date: 11/20/2023    ECHOCARDIOGRAM REPORT   Patient Name:   NEHEMYAH FOUSHEE Date of Exam: 11/20/2023 Medical Rec #:  [id number]           Height:       74.0 in Accession #:    [id number]           Weight:       200.6 lb Date of Birth:  01-21-1961            BSA:          2.176 m Patient Age:    63 years            BP:           104/68 mmHg Patient Gender: M                   HR:           66 bpm. Exam Location:  Inpatient Procedure: 2D Echo, Cardiac Doppler and Color Doppler (Both Spectral and Color            Flow Doppler were utilized during procedure). Indications:    I26.02 Pulmonary embolus  History:        Patient has no prior history of Echocardiogram examinations.  Signs/Symptoms:Shortness of Breath, Chest Pain and Dyspnea.  Sonographer:    Sheralyn Boatman RDCS Referring Phys: [id number] [facility] GOEL IMPRESSIONS  1. Left ventricular ejection fraction, by estimation, is 50 to 55%. The left ventricle has low normal function. The left ventricle demonstrates regional wall motion abnormalities (see scoring diagram/findings for description). Left ventricular diastolic  parameters are consistent with Grade I diastolic dysfunction (impaired relaxation). There is mild hypokinesis of the left ventricular, basal-mid inferolateral wall.  2. Right ventricular systolic function is normal. The right ventricular size is normal.  3. The mitral valve is normal in structure. Trivial mitral valve regurgitation. No evidence of mitral stenosis.  4. The aortic valve is tricuspid. Aortic valve regurgitation is not visualized. No aortic stenosis is present.  5. The inferior vena cava is dilated in size with >50% respiratory variability, suggesting right atrial pressure of 8 mmHg.  6. Cannot exclude a small PFO. Comparison(s): No prior Echocardiogram. Conclusion(s)/Recommendation(s): Otherwise normal echocardiogram, with minor abnormalities described in the report. FINDINGS  Left Ventricle: Left ventricular ejection fraction, by estimation, is 50 to 55%. The left ventricle has low normal function. The left ventricle demonstrates regional wall motion abnormalities. Mild hypokinesis of the left ventricular, basal-mid  inferolateral wall. The left ventricular internal cavity size was normal in size. There is no left ventricular hypertrophy. Left ventricular diastolic parameters are consistent with Grade I diastolic dysfunction (impaired relaxation). Right Ventricle: The right ventricular size is normal. No increase in right ventricular wall thickness. Right ventricular systolic function is normal. Left Atrium: Left atrial size was normal in size. Right Atrium: Right atrial size was normal in size. Pericardium: There is no evidence of pericardial effusion. Mitral Valve: Redundant chordae noted. The mitral valve is normal in structure. Trivial mitral valve regurgitation. No evidence of mitral valve stenosis. Tricuspid Valve: The tricuspid valve is normal in structure. Tricuspid valve regurgitation is trivial. No evidence of tricuspid stenosis. Aortic Valve: The aortic valve is tricuspid. Aortic valve regurgitation is not visualized. No aortic stenosis is present. Pulmonic Valve: The pulmonic valve was not well visualized. Pulmonic valve regurgitation is not visualized. No evidence of pulmonic stenosis. Aorta: The aortic root and ascending aorta are structurally normal, with no evidence of dilitation. Venous: The inferior vena cava is dilated in size with greater than 50% respiratory variability, suggesting right atrial pressure of 8 mmHg. IAS/Shunts: Cannot exclude a small PFO.  LEFT VENTRICLE PLAX 2D LVIDd:         5.20 cm      Diastology LVIDs:         4.00 cm      LV e' medial:    6.09 cm/s LV PW:         1.10 cm      LV E/e' medial:  8.1 LV IVS:        1.20 cm      LV e' lateral:   10.10 cm/s LVOT diam:     2.70 cm      LV E/e' lateral: 4.9 LV SV:         86 LV SV Index:   40 LVOT Area:     5.73 cm  LV Volumes (MOD) LV vol d, MOD A2C: 113.0 ml LV vol d, MOD A4C: 70.2 ml LV vol s, MOD A2C: 50.2 ml LV vol s, MOD A4C: 39.5 ml LV SV MOD A2C:     62.8 ml LV SV MOD A4C:     70.2 ml LV SV MOD BP:  47.3 ml RIGHT VENTRICLE              IVC RV S prime:     18.10 cm/s  IVC diam: 2.20 cm TAPSE (M-mode): 1.9 cm LEFT ATRIUM             Index        RIGHT ATRIUM           Index LA diam:        2.90 cm 1.33 cm/m   RA Area:     11.90 cm LA Vol (A2C):   23.2 ml 10.66 ml/m  RA Volume:   22.70 ml  10.43 ml/m LA Vol (A4C):   46.5 ml 21.37 ml/m LA Biplane Vol: 35.0 ml 16.08 ml/m  AORTIC VALVE LVOT Vmax:   79.20 cm/s LVOT Vmean:  50.100 cm/s LVOT VTI:    0.151 m  AORTA Ao Root diam: 3.50 cm Ao Asc diam:  3.30 cm MITRAL VALVE MV Area (PHT): 3.17 cm    SHUNTS MV Decel Time: 239 msec    Systemic VTI:  0.15 m MV E velocity: 49.40 cm/s  Systemic Diam: 2.70 cm MV A velocity: 44.70 cm/s MV E/A ratio:  1.11 Jodelle Red MD Electronically signed by Jodelle Red MD Signature Date/Time: 11/20/2023/1:22:07 PM    Final    US SCROTUM W/DOPPLER Result Date: 11/19/2023 CLINICAL DATA:  Scrotal pain and swelling EXAM: SCROTAL ULTRASOUND DOPPLER ULTRASOUND OF THE TESTICLES TECHNIQUE: Complete ultrasound examination of the testicles, epididymis, and other scrotal structures was performed. Color and spectral Doppler ultrasound were also utilized to evaluate blood flow to the testicles. COMPARISON:  CT 11/19/2023 FINDINGS: Right testicle Measurements: 4.6 x 2.6 x 3 cm. No mass or microlithiasis visualized. Left testicle Measurements: 4 x 1.9 x 2.6 cm. No mass or microlithiasis visualized. Right epididymis: Enlarged heterogeneous and hypervascular. Small cyst measuring 3 mm Left epididymis:  Enlarged heterogeneous and hypervascular. Hydrocele: Small bilateral hydroceles containing particulate debris. Varicocele:  None visualized. Pulsed Doppler interrogation of both testes demonstrates normal low resistance arterial and venous waveforms bilaterally. Diffuse scrotal wall thickening. IMPRESSION: 1. Negative for testicular torsion or mass. 2. Enlarged heterogeneous and hypervascular epididymides bilaterally suggestive of epididymitis. 3. Small bilateral  hydroceles containing particulate debris. 4. Diffuse scrotal wall thickening. Electronically Signed   By: Jasmine Pang M.D.   On: 11/19/2023 23:31   CT ABDOMEN PELVIS W CONTRAST Result Date: 11/19/2023 CLINICAL DATA:  High probability PE, recent surgery on prostate EXAM: CT ANGIOGRAPHY CHEST CT ABDOMEN AND PELVIS WITH CONTRAST TECHNIQUE: Multidetector CT imaging of the chest was performed using the standard protocol during bolus administration of intravenous contrast. Multiplanar CT image reconstructions and MIPs were obtained to evaluate the vascular anatomy. Multidetector CT imaging of the abdomen and pelvis was performed using the standard protocol during bolus administration of intravenous contrast. RADIATION DOSE REDUCTION: This exam was performed according to the departmental dose-optimization program which includes automated exposure control, adjustment of the mA and/or kV according to patient size and/or use of iterative reconstruction technique. CONTRAST:  OMNIPAQUE IOHEXOL 350 MG/ML SOLN COMPARISON:  MRI 11/03/2021 FINDINGS: CTA CHEST FINDINGS Cardiovascular: Satisfactory opacification of the pulmonary arteries to the segmental level. No evidence of pulmonary embolism. Positive for small volume acute bilateral pulmonary emboli. Small volume thrombus within distal left upper pulmonary artery with multiple small segmental and subsegmental left upper lobe pulmonary emboli. Small volume right upper lobe segmental and subsegmental and right lower lobe subsegmental pulmonary emboli. RV LV ratio 0.97. Nonaneurysmal aorta.  Normal cardiac size. No pericardial effusion Mediastinum/Nodes: Patent trachea. No thyroid mass. No suspicious lymph nodes. Esophagus within normal limits. Lungs/Pleura: No pleural effusion or pneumothorax. Subsegmental atelectasis in the right upper lobe. Musculoskeletal: No acute osseous abnormality. Review of the MIP images confirms the above findings. CT ABDOMEN and PELVIS FINDINGS  Hepatobiliary: Subcentimeter hypodensity in the liver too small to further characterize. No calcified gallstone or biliary dilatation Pancreas: Unremarkable. No pancreatic ductal dilatation or surrounding inflammatory changes. Spleen: Normal in size without focal abnormality. Adrenals/Urinary Tract: Adrenal glands are normal. Fullness of the renal collecting systems without obstructing stone. Mild distal urothelial thickening and stranding. Slightly thick-walled urinary bladder with perivesical stranding and mucosal enhancement. Stomach/Bowel: Stomach is within normal limits. Appendix appears normal. No evidence of bowel wall thickening, distention, or inflammatory changes. Vascular/Lymphatic: Aortic atherosclerosis. No enlarged abdominal or pelvic lymph nodes. Reproductive: Status post prostatectomy. Mild stranding between the bladder and seminal vesicles and surrounding the seminal vesicles. Large bilateral scrotal hydroceles and suspected diffuse scrotal thickening. No soft tissue gas at the scrotum or perineum to suggest necrotic infection Other: No free air Musculoskeletal: No acute osseous abnormality Review of the MIP images confirms the above findings. IMPRESSION: 1. Positive for small volume acute bilateral pulmonary emboli. Slightly elevated RV LV ratio 0.97. 2. Status post prostatectomy. Mild stranding between the bladder and seminal vesicles and surrounding the seminal vesicles, question postsurgical change versus infection. Slightly thick-walled urinary bladder with perivesical stranding and mucosal enhancement, suspicious for cystitis. Mild distal urothelial thickening and stranding, possible ascending urinary tract infection. No evidence for rim enhancing abscess at the surgical bed 3. Large bilateral scrotal hydroceles and suspected diffuse scrotal thickening. No soft tissue gas at the scrotum or perineum to suggest necrotic infection. Consider correlation with scrotal ultrasound 4. Aortic  atherosclerosis. Critical Value/emergent results were called by telephone at the time of interpretation on 11/19/2023 at 7:06 Pm to provider [facility] , who verbally acknowledged these results. Electronically Signed   By: Jasmine Pang M.D.   On: 11/19/2023 20:09   CT Angio Chest Pulmonary Embolism (PE) W or WO Contrast Result Date: 11/19/2023 CLINICAL DATA:  High probability PE, recent surgery on prostate EXAM: CT ANGIOGRAPHY CHEST CT ABDOMEN AND PELVIS WITH CONTRAST TECHNIQUE: Multidetector CT imaging of the chest was performed using the standard protocol during bolus administration of intravenous contrast. Multiplanar CT image reconstructions and MIPs were obtained to evaluate the vascular anatomy. Multidetector CT imaging of the abdomen and pelvis was performed using the standard protocol during bolus administration of intravenous contrast. RADIATION DOSE REDUCTION: This exam was performed according to the departmental dose-optimization program which includes automated exposure control, adjustment of the mA and/or kV according to patient size and/or use of iterative reconstruction technique. CONTRAST:  OMNIPAQUE IOHEXOL 350 MG/ML SOLN COMPARISON:  MRI 11/03/2021 FINDINGS: CTA CHEST FINDINGS Cardiovascular: Satisfactory opacification of the pulmonary arteries to the segmental level. No evidence of pulmonary embolism. Positive for small volume acute bilateral pulmonary emboli. Small volume thrombus within distal left upper pulmonary artery with multiple small segmental and subsegmental left upper lobe pulmonary emboli. Small volume right upper lobe segmental and subsegmental and right lower lobe subsegmental pulmonary emboli. RV LV ratio 0.97. Nonaneurysmal aorta. Normal cardiac size. No pericardial effusion Mediastinum/Nodes: Patent trachea. No thyroid mass. No suspicious lymph nodes. Esophagus within normal limits. Lungs/Pleura: No pleural effusion or pneumothorax. Subsegmental atelectasis in the right  upper lobe. Musculoskeletal: No acute osseous abnormality. Review of the MIP images confirms the above findings. CT  ABDOMEN and PELVIS FINDINGS Hepatobiliary: Subcentimeter hypodensity in the liver too small to further characterize. No calcified gallstone or biliary dilatation Pancreas: Unremarkable. No pancreatic ductal dilatation or surrounding inflammatory changes. Spleen: Normal in size without focal abnormality. Adrenals/Urinary Tract: Adrenal glands are normal. Fullness of the renal collecting systems without obstructing stone. Mild distal urothelial thickening and stranding. Slightly thick-walled urinary bladder with perivesical stranding and mucosal enhancement. Stomach/Bowel: Stomach is within normal limits. Appendix appears normal. No evidence of bowel wall thickening, distention, or inflammatory changes. Vascular/Lymphatic: Aortic atherosclerosis. No enlarged abdominal or pelvic lymph nodes. Reproductive: Status post prostatectomy. Mild stranding between the bladder and seminal vesicles and surrounding the seminal vesicles. Large bilateral scrotal hydroceles and suspected diffuse scrotal thickening. No soft tissue gas at the scrotum or perineum to suggest necrotic infection Other: No free air Musculoskeletal: No acute osseous abnormality Review of the MIP images confirms the above findings. IMPRESSION: 1. Positive for small volume acute bilateral pulmonary emboli. Slightly elevated RV LV ratio 0.97. 2. Status post prostatectomy. Mild stranding between the bladder and seminal vesicles and surrounding the seminal vesicles, question postsurgical change versus infection. Slightly thick-walled urinary bladder with perivesical stranding and mucosal enhancement, suspicious for cystitis. Mild distal urothelial thickening and stranding, possible ascending urinary tract infection. No evidence for rim enhancing abscess at the surgical bed 3. Large bilateral scrotal hydroceles and suspected diffuse scrotal  thickening. No soft tissue gas at the scrotum or perineum to suggest necrotic infection. Consider correlation with scrotal ultrasound 4. Aortic atherosclerosis. Critical Value/emergent results were called by telephone at the time of interpretation on 11/19/2023 at 7:06 Pm to provider [facility] , who verbally acknowledged these results. Electronically Signed   By: Jasmine Pang M.D.   On: 11/19/2023 20:09   VAS [place] LOWER EXTREMITY VENOUS (DVT) (7a-7p) Result Date: 11/19/2023  Lower Venous DVT Study Patient Name:  NAYEF COLLEGE  Date of Exam:   11/19/2023 Medical Rec #: [id number]            Accession #:    [id number] Date of Birth: 1961/03/05             Patient Gender: M Patient Age:   60 years Exam Location:  [facility] Procedure:      VAS [place] LOWER EXTREMITY VENOUS (DVT) Referring Phys: Asher Muir BARRETT --------------------------------------------------------------------------------  Indications: Pain.  Risk Factors: Recent prostate surgery and hernia repair (10/25/2023). Comparison Study: No previous exams Performing Technologist: Jody Hill RVT, RDMS  Examination Guidelines: A complete evaluation includes B-mode imaging, spectral Doppler, color Doppler, and power Doppler as needed of all accessible portions of each vessel. Bilateral testing is considered an integral part of a complete examination. Limited examinations for reoccurring indications may be performed as noted. The reflux portion of the exam is performed with the patient in reverse Trendelenburg.  +---------+---------------+---------+-----------+---------------+--------------+ RIGHT    CompressibilityPhasicitySpontaneityProperties     Thrombus Aging +---------+---------------+---------+-----------+---------------+--------------+ CFV      Full                                                             +---------+---------------+---------+-----------+---------------+--------------+ SFJ      Full                                                              +---------+---------------+---------+-----------+---------------+--------------+  FV Prox  Full                                                             +---------+---------------+---------+-----------+---------------+--------------+ FV Mid   Full                                                             +---------+---------------+---------+-----------+---------------+--------------+ FV DistalFull                                                             +---------+---------------+---------+-----------+---------------+--------------+ PFV      Full                                                             +---------+---------------+---------+-----------+---------------+--------------+ POP      Full                                                             +---------+---------------+---------+-----------+---------------+--------------+ PTV      Full                                                             +---------+---------------+---------+-----------+---------------+--------------+ PERO                                                       Not well                                                                  visualized     +---------+---------------+---------+-----------+---------------+--------------+ GSV      None           No       No         mid calf to    Acute  ankle                         +---------+---------------+---------+-----------+---------------+--------------+ VV's     None           No       No         Distal medial  Acute                                                      thigh & mid                                                               calf                          +---------+---------------+---------+-----------+---------------+--------------+    +----+---------------+---------+-----------+----------+--------------+ LEFTCompressibilityPhasicitySpontaneityPropertiesThrombus Aging +----+---------------+---------+-----------+----------+--------------+ CFV Full           Yes      Yes                                 +----+---------------+---------+-----------+----------+--------------+     Summary: RIGHT: - Findings consistent with acute superficial vein thrombosis involving the right great saphenous vein, and right varicosities or other superficial veins.  - There is no evidence of deep vein thrombosis in the lower extremity.  - No cystic structure found in the popliteal fossa.  LEFT: - No evidence of common femoral vein obstruction.   *See table(s) above for measurements and observations. Electronically signed by Lemar Livings MD on 11/19/2023 at 7:46:54 PM.    Final    DG Chest 2 View Result Date: 11/19/2023 CLINICAL DATA:  Shortness of breath and cough, recent prostate surgery 1 week ago, fever last night EXAM: CHEST - 2 VIEW COMPARISON:  04/06/2019 FINDINGS: The heart size and mediastinal contours are within normal limits. Similar decreased lung volumes. Both lungs are clear. Negative for edema, effusion or pneumothorax. Trachea midline. The visualized skeletal structures are unremarkable. IMPRESSION: Low volume exam without acute process. Electronically Signed   By: Judie Petit.  Shick M.D.   On: 11/19/2023 16:31    Time coordinating discharge: 45 mins  SIGNED:  Carollee Herter, DO [facility] 11/22/23, 9:18 AM

## 2023-11-22 NOTE — Progress Notes (Signed)
 PROGRESS NOTE    Billy [name]  MWN:[id number] DOB: 06/18/1961 DOA: 11/19/2023 PCP: Montez Hageman, DO  Subjective: Patient seen and examined.  Patient with persistent left testicular pain. Urine cx grew E. Coli sensitive to cipro. Pt has been on levaquin. Less left testicle pain. Able to ambulate with PT yesterday. Ready for DC to home.   Hospital Course: HPI: Billy [name] is a 63 y.o. male with medical history significant of BPH LUTS s.p "simple prostatectomy" on 10/25/2023 robot assisted. Umbilical hernia repair.   Since then patinet reports some scrotal pain. Patient has recived outpatient doxycycline for same.   Paitnet returned to ER today due to several new symptoms : patinet apparently had a fever last night and difficulty with urination flow. No abd pain. Paitnet has had persistent minimal hematuria sicne the time of his surgery. No diarrhea, no vomting. Patient also reports new onset cough, sob and chest pain pleuritic since this AM . No expectoration. Chest pain only with cuohging. Sob with couhging. No leg swelling, no rash on skin. He also reports right lower extremity discomfort medial thigh distally.  Significant Events: Admitted 11/19/2023 for acute PE   Significant Labs: WBC 9.3, HgB 14, plt 259 UA, turbid, HgB mod, Nitrite Positive, LE large, WBC >50 Na 135, K 3.8, CO2 of 25, BUN 16, scr 1.65  Significant Imaging Studies: CXR Low volume exam without acute process.  CTPA/CT abd/pelvis Positive for small volume acute bilateral pulmonary emboli. Slightly elevated RV LV ratio 0.97. 2. Status post prostatectomy. Mild stranding between the bladder and seminal vesicles and  surrounding the seminal vesicles, question postsurgical change versus infection. Slightly thick-walled urinary bladder with perivesical stranding and mucosal enhancement, suspicious for cystitis. Mild distal urothelial thickening and stranding, possible ascending urinary tract infection. No  evidence  for rim enhancing abscess at the surgical bed 3. Large bilateral scrotal hydroceles and suspected diffuse scrotal thickening. No soft tissue gas at the scrotum or perineum to suggest necrotic infection. Consider correlation with scrotal ultrasound 4. Aortic atherosclerosis. Scrotal U/S Negative for testicular torsion or mass. 2. Enlarged heterogeneous and hypervascular epididymides bilaterally suggestive of epididymitis. 3. Small bilateral hydroceles containing particulate debris. 4. Diffuse scrotal wall thickening  Antibiotic Therapy: Anti-infectives (From admission, onward)    Start     Dose/Rate Route Frequency Ordered Stop   11/20/23 1700  vancomycin (VANCOREADY) IVPB 1500 mg/300 mL       Placed in "Followed by" Linked Group   1,500 mg 150 mL/hr over 120 Minutes Intravenous Every 24 hours 11/19/23 1618     11/20/23 1000  bictegravir-emtricitabine-tenofovir AF (BIKTARVY) 50-200-25 MG per tablet 1 tablet        1 tablet Oral Daily 11/19/23 2151     11/20/23 0500  cefTRIAXone (ROCEPHIN) 2 g in sodium chloride 0.9 % 100 mL IVPB        2 g 200 mL/hr over 30 Minutes Intravenous  Once 11/19/23 2138 11/20/23 0556   11/19/23 2200  levofloxacin (LEVAQUIN) IVPB 500 mg        500 mg 100 mL/hr over 60 Minutes Intravenous Every 24 hours 11/19/23 2138 11/29/23 2159   11/19/23 1700  vancomycin (VANCOREADY) IVPB 2000 mg/400 mL       Placed in "Followed by" Linked Group   2,000 mg 200 mL/hr over 120 Minutes Intravenous  Once 11/19/23 1618 11/19/23 2033   11/19/23 1630  ceFEPIme (MAXIPIME) 2 g in sodium chloride 0.9 % 100 mL IVPB  Status:  Discontinued  2 g 200 mL/hr over 30 Minutes Intravenous Every 12 hours 11/19/23 1618 11/19/23 2138       Procedures:   Consultants: urology    Assessment and Plan: * Acute pulmonary embolism (HCC) On admission. Heparin infusion started. I do not see indication for thrombolysis at thsi time. No cor pulmonale. Check echo.  11-20-2023 echo  negative for RV dysfunction.  He only has superficial thrombophlebitis on his lower extremity ultrasounds.  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  Stop IV heparin.  Change over to p.o. Eliquis.  11-21-2023 tolerating Eliquis.  On room air.  11-22-2023 discharged to home on Eliquis.  He will need least 6 months of therapy.  He will need to see his PCP in the office in 1 to 2 weeks to get a refill on his Eliquis.  Superficial vein thrombosis On admission. On right with  pulm embolism. Started on heaprin infusion - c.w. same. Patient advised to report any new symptoms headche, skin bruising, severe back pain to staff promptly.  11-20-2023 LE U/S negative for DVT. Has superficial thrombophlebitis. But with his acute PE he most likely has acute LE DVT. Change IV heparin to po Eliquis.  11-21-2023 continue with p.o. Eliquis.  He will need 6 months of therapy due to his PE.  E. coli UTI On admission. See on imaging . Check urine culture. Abx as above.  11-20-2023 change abx to levaquin. Discussed with urology. No operative intervention needed.   11-21-2023  Urine culture growing gram-negative rods.  Awaiting final ID.  On Levaquin.  11-22-2023 Urine cx grew E. Coli sensitive to cipro. Pt has been on levaquin. Less left testicle pain.  Discharged home on Levaquin.  Orchitis On admission. Left. S.p vanco+cefepime in the ER. Give one dose of ceftriaxone and rx with 10 days of levoflxacin. GC probe pending for urine. I found the left testicle pretty hard. Look forward to Dr. Margo Aye (urology eval in this regard)  11-20-2023 change abx to levaquin. Discussed with urology. No operative intervention needed.   11-21-2023 changed to p.o. Levaquin.  Awaiting urine culture.  Gonorrhea and Chlamydia probe are negative.  11-22-2023 urine culture grew E. coli.  Discharged to home on Levaquin.  Urology wanted 14 days of therapy.  Will discharge him on additional 11 days of Levaquin.  Hypercalcemia On  admission Chronic. Will send work up in AM  11-20-2023 vitamin D level is now. PTH pending  11-21-2023 resolved   11-22-2023 discharge calcium of 10.1  CKD stage 3a, GFR 45-59 ml/min (HCC) - baseline scR 1.3-1.5 11-20-2023 Baseline scr 1.3-1.5  11-21-2023 stable  11-22-2023 discharge creatinine 1.58, BUN of 19  Human immunodeficiency virus (HIV) disease (HCC) 11-20-2023 continue with [facility]  11-21-2023 continue Biktarvy.  CD4 count is normal at 593.  11-22-2023 Continue Biktarvy at home.  CONSTIPATION, CHRONIC 11-21-2023 Dulcolax 10 mg p.o. and 34 mg of MiraLAX give him a bowel movement yesterday.  He is very happy about this.  11-22-2023 stable.  DVT prophylaxis:  apixaban (ELIQUIS) tablet 10 mg  apixaban (ELIQUIS) tablet 5 mg     Code Status: Full Code Family Communication: pt is decisional. No family at bedside Disposition Plan: return home Reason for continuing need for hospitalization: medically stable for DC.  Objective: Vitals:   11/21/23 0604 11/21/23 1357 11/21/23 2011 11/22/23 0524  BP: 117/82 120/80 118/71 101/66  Pulse: 62 73 93 63  Resp: 16 18 17 16   Temp: 98.5 F (36.9 C) 98.2 F (36.8 C) 98.1  F (36.7 C) 98.3 F (36.8 C)  TempSrc: Oral Oral Oral Oral  SpO2: 100% 100% 100% 100%  Weight:      Height:        Intake/Output Summary (Last 24 hours) at 11/22/2023 0916 Last data filed at 11/22/2023 0524 Gross per 24 hour  Intake 600 ml  Output 0 ml  Net 600 ml   Filed Weights   11/19/23 1151  Weight: 91 kg    Examination:  Physical Exam Vitals and nursing note reviewed.  Constitutional:      General: He is not in acute distress.    Appearance: He is normal weight. He is not ill-appearing, toxic-appearing or diaphoretic.  HENT:     Head: Normocephalic and atraumatic.     Nose: Nose normal.  Cardiovascular:     Rate and Rhythm: Normal rate and regular rhythm.  Pulmonary:     Effort: Pulmonary effort is normal.     Breath sounds:  Normal breath sounds.  Abdominal:     General: Abdomen is flat. Bowel sounds are normal. There is no distension.     Palpations: Abdomen is soft.  Genitourinary:    Comments: Left testicle less swollen, minimal pain with palpation. Skin:    General: Skin is warm and dry.     Capillary Refill: Capillary refill takes less than 2 seconds.  Neurological:     Mental Status: He is alert and oriented to person, place, and time.    Data Reviewed: I have personally reviewed following labs and imaging studies  CBC: Recent Labs  Lab 11/19/23 1200 11/20/23 0428 11/21/23 0429 11/22/23 0352  WBC 9.3 7.6 6.9 7.9  NEUTROABS  --   --  4.3 3.9  HGB 14.0 12.1* 12.5* 12.4*  HCT 40.4 35.9* 36.2* 36.8*  MCV 87.8 89.1 88.5 90.0  PLT 259 194 232 258   Basic Metabolic Panel: Recent Labs  Lab 11/19/23 1200 11/20/23 0540 11/21/23 0429 11/22/23 0352  NA 135 132* 136 136  K 3.8 3.7 4.0 4.1  CL 99 100 103 102  CO2 25 23 25 26   GLUCOSE 123* 102* 108* 97  BUN 16 16 15 19   CREATININE 1.65* 1.31* 1.53* 1.58*  CALCIUM 10.5* 9.8 10.0 10.1  PHOS  --  3.6  --   --    GFR: Estimated Creatinine Clearance: 57.1 mL/min (A) (by C-G formula based on SCr of 1.58 mg/dL (H)). Liver Function Tests: Recent Labs  Lab 11/19/23 1200  AST 19  ALT 18  ALKPHOS 75  BILITOT 0.8  PROT 7.8  ALBUMIN 3.6   Recent Labs  Lab 11/19/23 1200  LIPASE 22    Coagulation Profile: Recent Labs  Lab 11/20/23 0428  INR 1.2   BNP (last 3 results) Recent Labs    11/19/23 1700  BNP 54.2   Sepsis Labs: Recent Labs  Lab 11/19/23 1934  LATICACIDVEN 0.8    Recent Results (from the past 240 hours)  Blood culture (routine x 2)     Status: None (Preliminary result)   Collection Time: 11/19/23  5:00 PM   Specimen: BLOOD  Result Value Ref Range Status   Specimen Description   Final    BLOOD BLOOD LEFT ARM Performed at [facility], [address], [place], [place] [address]    Special  Requests   Final    BOTTLES DRAWN AEROBIC AND ANAEROBIC Blood Culture adequate volume Performed at [facility], [address] [place], [place] [address]  Culture   Final    NO GROWTH 3 DAYS Performed at [facility], [address], [place], [place] [address]    Report Status PENDING  Incomplete  Blood culture (routine x 2)     Status: None (Preliminary result)   Collection Time: 11/19/23  5:28 PM   Specimen: BLOOD  Result Value Ref Range Status   Specimen Description   Final    BLOOD SITE NOT SPECIFIED Performed at [facility], [address] [place], [place] [address]    Special Requests   Final    BOTTLES DRAWN AEROBIC AND ANAEROBIC Blood Culture adequate volume Performed at [facility], [address], [place], [place] [address]    Culture   Final    NO GROWTH 3 DAYS Performed at [facility], [address] [place], [place] [address]    Report Status PENDING  Incomplete  Culture, Urine (Do not remove urinary catheter, catheter placed by urology or difficult to place)     Status: Abnormal   Collection Time: 11/19/23  9:45 PM   Specimen: Urine, Catheterized  Result Value Ref Range Status   Specimen Description   Final    URINE, CATHETERIZED Performed at [facility], [address] [place] [address] [place], [place] [address]    Special Requests   Final    NONE Performed at [facility], [address], [place], [place] [address]    Culture >=100,000 COLONIES/mL ESCHERICHIA COLI (A)  Final   Report Status 11/21/2023 FINAL  Final   Organism ID, Bacteria ESCHERICHIA COLI (A)  Final      Susceptibility   Escherichia coli - MIC*    AMPICILLIN >=32 RESISTANT Resistant     CEFAZOLIN >=64 RESISTANT Resistant     CEFEPIME <=0.12 SENSITIVE Sensitive     CEFTRIAXONE <=0.25 SENSITIVE Sensitive     CIPROFLOXACIN <=0.25 SENSITIVE Sensitive     GENTAMICIN <=1 SENSITIVE Sensitive      IMIPENEM <=0.25 SENSITIVE Sensitive     NITROFURANTOIN <=16 SENSITIVE Sensitive     TRIMETH/SULFA >=320 RESISTANT Resistant     AMPICILLIN/SULBACTAM >=32 RESISTANT Resistant     PIP/TAZO 64 INTERMEDIATE Intermediate ug/mL    * >=100,000 COLONIES/mL ESCHERICHIA COLI     Radiology Studies: ECHOCARDIOGRAM COMPLETE Result Date: 11/20/2023    ECHOCARDIOGRAM REPORT   Patient Name:   PERCIVAL GLASHEEN Date of Exam: 11/20/2023 Medical Rec #:  [id number]           Height:       74.0 in Accession #:    [id number]          Weight:       200.6 lb Date of Birth:  07-09-1961            BSA:          2.176 m Patient Age:    63 years            BP:           104/68 mmHg Patient Gender: M                   HR:           66 bpm. Exam Location:  Inpatient Procedure: 2D Echo, Cardiac Doppler and Color Doppler (Both Spectral and Color            Flow Doppler were utilized during procedure). Indications:    I26.02 Pulmonary embolus  History:  Patient has no prior history of Echocardiogram examinations.                 Signs/Symptoms:Shortness of Breath, Chest Pain and Dyspnea.  Sonographer:    Sheralyn Boatman RDCS Referring Phys: [id number] [facility] GOEL IMPRESSIONS  1. Left ventricular ejection fraction, by estimation, is 50 to 55%. The left ventricle has low normal function. The left ventricle demonstrates regional wall motion abnormalities (see scoring diagram/findings for description). Left ventricular diastolic  parameters are consistent with Grade I diastolic dysfunction (impaired relaxation). There is mild hypokinesis of the left ventricular, basal-mid inferolateral wall.  2. Right ventricular systolic function is normal. The right ventricular size is normal.  3. The mitral valve is normal in structure. Trivial mitral valve regurgitation. No evidence of mitral stenosis.  4. The aortic valve is tricuspid. Aortic valve regurgitation is not visualized. No aortic stenosis is present.  5. The inferior vena cava is dilated in size  with >50% respiratory variability, suggesting right atrial pressure of 8 mmHg.  6. Cannot exclude a small PFO. Comparison(s): No prior Echocardiogram. Conclusion(s)/Recommendation(s): Otherwise normal echocardiogram, with minor abnormalities described in the report. FINDINGS  Left Ventricle: Left ventricular ejection fraction, by estimation, is 50 to 55%. The left ventricle has low normal function. The left ventricle demonstrates regional wall motion abnormalities. Mild hypokinesis of the left ventricular, basal-mid inferolateral wall. The left ventricular internal cavity size was normal in size. There is no left ventricular hypertrophy. Left ventricular diastolic parameters are consistent with Grade I diastolic dysfunction (impaired relaxation). Right Ventricle: The right ventricular size is normal. No increase in right ventricular wall thickness. Right ventricular systolic function is normal. Left Atrium: Left atrial size was normal in size. Right Atrium: Right atrial size was normal in size. Pericardium: There is no evidence of pericardial effusion. Mitral Valve: Redundant chordae noted. The mitral valve is normal in structure. Trivial mitral valve regurgitation. No evidence of mitral valve stenosis. Tricuspid Valve: The tricuspid valve is normal in structure. Tricuspid valve regurgitation is trivial. No evidence of tricuspid stenosis. Aortic Valve: The aortic valve is tricuspid. Aortic valve regurgitation is not visualized. No aortic stenosis is present. Pulmonic Valve: The pulmonic valve was not well visualized. Pulmonic valve regurgitation is not visualized. No evidence of pulmonic stenosis. Aorta: The aortic root and ascending aorta are structurally normal, with no evidence of dilitation. Venous: The inferior vena cava is dilated in size with greater than 50% respiratory variability, suggesting right atrial pressure of 8 mmHg. IAS/Shunts: Cannot exclude a small PFO.  LEFT VENTRICLE PLAX 2D LVIDd:         5.20  cm      Diastology LVIDs:         4.00 cm      LV e' medial:    6.09 cm/s LV PW:         1.10 cm      LV E/e' medial:  8.1 LV IVS:        1.20 cm      LV e' lateral:   10.10 cm/s LVOT diam:     2.70 cm      LV E/e' lateral: 4.9 LV SV:         86 LV SV Index:   40 LVOT Area:     5.73 cm  LV Volumes (MOD) LV vol d, MOD A2C: 113.0 ml LV vol d, MOD A4C: 70.2 ml LV vol s, MOD A2C: 50.2 ml LV vol s, MOD A4C: 39.5 ml LV SV  MOD A2C:     62.8 ml LV SV MOD A4C:     70.2 ml LV SV MOD BP:      47.3 ml RIGHT VENTRICLE             IVC RV S prime:     18.10 cm/s  IVC diam: 2.20 cm TAPSE (M-mode): 1.9 cm LEFT ATRIUM             Index        RIGHT ATRIUM           Index LA diam:        2.90 cm 1.33 cm/m   RA Area:     11.90 cm LA Vol (A2C):   23.2 ml 10.66 ml/m  RA Volume:   22.70 ml  10.43 ml/m LA Vol (A4C):   46.5 ml 21.37 ml/m LA Biplane Vol: 35.0 ml 16.08 ml/m  AORTIC VALVE LVOT Vmax:   79.20 cm/s LVOT Vmean:  50.100 cm/s LVOT VTI:    0.151 m  AORTA Ao Root diam: 3.50 cm Ao Asc diam:  3.30 cm MITRAL VALVE MV Area (PHT): 3.17 cm    SHUNTS MV Decel Time: 239 msec    Systemic VTI:  0.15 m MV E velocity: 49.40 cm/s  Systemic Diam: 2.70 cm MV A velocity: 44.70 cm/s MV E/A ratio:  1.11 Jodelle Red MD Electronically signed by Jodelle Red MD Signature Date/Time: 11/20/2023/1:22:07 PM    Final     Scheduled Meds:  apixaban  10 mg Oral BID   Followed by   Melene Muller ON 11/27/2023] apixaban  5 mg Oral BID   bictegravir-emtricitabine-tenofovir AF  1 tablet Oral Daily   levofloxacin  500 mg Oral Daily   polyethylene glycol  34 g Oral Daily   Continuous Infusions:   LOS: 3 days   Time spent: 40 minutes  Carollee Herter, DO  [facility]  11/22/2023, 9:16 AM

## 2023-11-22 NOTE — Plan of Care (Signed)
  Problem: Education: Goal: Knowledge of the procedure and recovery process will improve Outcome: Adequate for Discharge   Problem: Bowel/Gastric: Goal: Gastrointestinal status for postoperative course will improve Outcome: Adequate for Discharge   Problem: Pain Management: Goal: General experience of comfort will improve Outcome: Adequate for Discharge   Problem: Skin Integrity: Goal: Demonstration of wound healing without infection will improve Outcome: Adequate for Discharge   Problem: Urinary Elimination: Goal: Ability to avoid or minimize complications of infection will improve Outcome: Adequate for Discharge Goal: Ability to achieve and maintain urine output will improve Outcome: Adequate for Discharge Goal: Home care management will improve Outcome: Adequate for Discharge   Problem: Education: Goal: Knowledge of General Education information will improve Description: Including pain rating scale, medication(s)/side effects and non-pharmacologic comfort measures Outcome: Adequate for Discharge   Problem: Health Behavior/Discharge Planning: Goal: Ability to manage health-related needs will improve Outcome: Adequate for Discharge   Problem: Clinical Measurements: Goal: Ability to maintain clinical measurements within normal limits will improve Outcome: Adequate for Discharge Goal: Will remain free from infection Outcome: Adequate for Discharge Goal: Diagnostic test results will improve Outcome: Adequate for Discharge Goal: Respiratory complications will improve Outcome: Adequate for Discharge Goal: Cardiovascular complication will be avoided Outcome: Adequate for Discharge   Problem: Activity: Goal: Risk for activity intolerance will decrease Outcome: Adequate for Discharge   Problem: Nutrition: Goal: Adequate nutrition will be maintained Outcome: Adequate for Discharge   Problem: Coping: Goal: Level of anxiety will decrease Outcome: Adequate for Discharge    Problem: Elimination: Goal: Will not experience complications related to bowel motility Outcome: Adequate for Discharge Goal: Will not experience complications related to urinary retention Outcome: Adequate for Discharge   Problem: Pain Managment: Goal: General experience of comfort will improve and/or be controlled Outcome: Adequate for Discharge   Problem: Safety: Goal: Ability to remain free from injury will improve Outcome: Adequate for Discharge   Problem: Skin Integrity: Goal: Risk for impaired skin integrity will decrease Outcome: Adequate for Discharge

## 2023-11-22 NOTE — Progress Notes (Signed)
Reviewed written d/c instructions w pt and all questions answered. He verbalized understanding. D/ C via w/c w all belongings in stable condition.

## 2023-11-24 LAB — CULTURE, BLOOD (ROUTINE X 2)
Culture: NO GROWTH
Culture: NO GROWTH
Special Requests: ADEQUATE
Special Requests: ADEQUATE

## 2023-12-04 ENCOUNTER — Other Ambulatory Visit: Payer: Self-pay | Admitting: Internal Medicine

## 2023-12-04 DIAGNOSIS — B20 Human immunodeficiency virus [HIV] disease: Secondary | ICD-10-CM

## 2024-01-03 NOTE — Progress Notes (Signed)
 The 10-year ASCVD risk score (Arnett DK, et al., 2019) is: 9.1%   Values used to calculate the score:     Age: 63 years     Sex: Male     Is Non-Hispanic African American: Yes     Diabetic: No     Tobacco smoker: No     Systolic Blood Pressure: 130 mmHg     Is BP treated: No     HDL Cholesterol: 37 mg/dL     Total Cholesterol: 168 mg/dL  No current statin therapy, next appointment note updated.  Dollie Mayse, BSN, RN

## 2024-03-04 NOTE — Progress Notes (Signed)
 Cardiology Office Note:   Date:  03/10/2024  ID:  Billy [name], DOB [birth year], MRN [id number] PCP:  Shlomo Darryle BROCKS, DO  [facility] Providers Cardiologist:  Wendel Haws, MD Referring MD: Laurence Locus, DO  Chief Complaint/Reason for Referral:  Abnormal echocardiogram ASSESSMENT:    1. Abnormal echocardiogram   2. Acute pulmonary embolism without acute cor pulmonale, unspecified pulmonary embolism type (HCC)   3. Secondary hypercoagulable state (HCC)   4. Hyperlipidemia LDL goal <70   5. Aortic atherosclerosis (HCC)   6. Stage 3a chronic kidney disease (HCC)   7. Human immunodeficiency virus (HIV) disease (HCC)   8. Fatigue, unspecified type     PLAN:   In order of problems listed above: Abnormal echocardiogram.  Mild LV wall motion abnormalities.  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  Will repeat echocardiogram and decide about further evaluation if needed. Pulmonary embolism:  Continue Eliquis  5 mg twice daily for 6 months.  This seems like a provoked DVT. Secondary hypercoagulable state: Continue Eliquis  5 mg twice daily Hyperlipidemia: Patient has evidence of aortic atherosclerosis.  Consider atorvastatin 20 mg daily with goal LDL of less than 70. Aortic atherosclerosis: Continue Eliquis  5 mg twice daily and consider atorvastatin 20 mg daily. CKD stage IIIa: Consider ARB and SGLT2 inhibitor.  Patient will follow-up with PCP to discuss further. HIV: Continue Biktarvy  50/200/25 mg daily.  Dispo:  Return if symptoms worsen or fail to improve.       Labs/tests ordered: Orders Placed This Encounter  Procedures   ECHOCARDIOGRAM COMPLETE    Current medicines are reviewed at length with the patient today.  The patient does not have concerns regarding medicines.  I spent 38 minutes reviewing all clinical data during and prior to this visit including all relevant imaging studies, laboratories, clinical information from other health systems and prior notes  from both Cardiology and other specialties, interviewing the patient, conducting a complete physical examination, and coordinating care in order to formulate a comprehensive and personalized evaluation and treatment plan.   History of Present Illness:    FOCUSED PROBLEM LIST:   HIV On HAART PE April 2025 Small volume acute bilateral PE chest CT  Hyperlipidemia Aortic atherosclerosis Chest CT 2025 CKD stage IIIa  July 2025:  Patient consents to use of AI scribe. The patient is a 63 year old male with above listed medical problems referred for recommendations regarding the patient's echocardiogram abnormality.  The patient underwent robotic assisted prostatectomy in March of this year.  He then presented in April with small volume acute bilateral PE.  He was treated treated with anticoagulation and transition to Eliquis .  An echocardiogram demonstrated an ejection fraction of 50 to 55% with mild hypokinesis of the basal mid and inferior lateral walls.  For this reason he is referred for cardiology consultation.   He is currently on Eliquis  for anticoagulation without issues of bleeding or bruising.  He has a history of HIV and is on medicines for HIV. He is aware of having weak kidneys, which was noted in his lab results, but has not been previously informed about this by his healthcare providers.  He works in Lobby[employer] and is able to perform daily activities, including walking and going up and down stairs. No shortness of breath, lightheadedness, blacking out spells, or problems breathing while lying flat. However, he has noticed increased fatigue over the past week. He does not smoke.  He is otherwise without cardiovascular complaints.    Current Medications: Current Meds  Medication Sig  BIKTARVY  50-200-25 MG TABS tablet TAKE 1 TABLET BY MOUTH DAILY.   ELIQUIS  5 MG TABS tablet Take 5 mg by mouth 2 (two) times daily.     Review of Systems:   Please see the history of present  illness.    All other systems reviewed and are negative.     EKGs/Labs/Other Test Reviewed:   EKG: April 2025 sinus rhythm and left anterior fascicular block  EKG Interpretation Date/Time:    Ventricular Rate:    PR Interval:    QRS Duration:    QT Interval:    QTC Calculation:   R Axis:      Text Interpretation:           Risk Assessment/Calculations:          Physical Exam:   VS:  BP 120/70   Pulse 81   Ht 6' 2 (1.88 m)   Wt 210 lb (95.3 kg)   SpO2 98%   BMI 26.96 kg/m        Wt Readings from Last 3 Encounters:  03/10/24 210 lb (95.3 kg)  11/19/23 200 lb 9.9 oz (91 kg)  11/11/23 202 lb (91.6 kg)      GENERAL:  No apparent distress, AOx3 HEENT:  No carotid bruits, +2 carotid impulses, no scleral icterus CAR: RRR Irregular no murmurs, gallops, rubs, or thrills RES:  Clear to auscultation bilaterally ABD:  Soft, nontender, nondistended, positive bowel sounds x 4 VASC:  +2 radial pulses, +2 carotid pulses NEURO:  CN 2-12 grossly intact; motor and sensory grossly intact PSYCH:  No active depression or anxiety EXT:  No edema, ecchymosis, or cyanosis  Signed, Lurena MARLA Red, MD  03/10/2024 2:18 PM    [facility] [address] [place], [place], [place]  [address] Phone: [phone]; Fax: [phone]   Note:  This document was prepared using Dragon voice recognition software and may include unintentional dictation errors.

## 2024-03-10 ENCOUNTER — Ambulatory Visit: Attending: Internal Medicine | Admitting: Internal Medicine

## 2024-03-10 ENCOUNTER — Encounter: Payer: Self-pay | Admitting: Internal Medicine

## 2024-03-10 VITALS — BP 120/70 | HR 81 | Ht 74.0 in | Wt 210.0 lb

## 2024-03-10 DIAGNOSIS — B20 Human immunodeficiency virus [HIV] disease: Secondary | ICD-10-CM

## 2024-03-10 DIAGNOSIS — E785 Hyperlipidemia, unspecified: Secondary | ICD-10-CM | POA: Diagnosis not present

## 2024-03-10 DIAGNOSIS — D6869 Other thrombophilia: Secondary | ICD-10-CM

## 2024-03-10 DIAGNOSIS — I700 Atherosclerosis of aorta: Secondary | ICD-10-CM

## 2024-03-10 DIAGNOSIS — I2699 Other pulmonary embolism without acute cor pulmonale: Secondary | ICD-10-CM | POA: Diagnosis not present

## 2024-03-10 DIAGNOSIS — N1831 Chronic kidney disease, stage 3a: Secondary | ICD-10-CM

## 2024-03-10 DIAGNOSIS — R931 Abnormal findings on diagnostic imaging of heart and coronary circulation: Secondary | ICD-10-CM | POA: Diagnosis not present

## 2024-03-10 DIAGNOSIS — R5383 Other fatigue: Secondary | ICD-10-CM

## 2024-03-10 NOTE — Patient Instructions (Signed)
 Medication Instructions:  No changes *If you need a refill on your cardiac medications before your next appointment, please call your pharmacy*   Lab Work: none   Testing/Procedures: Your physician has requested that you have an echocardiogram. Echocardiography is a painless test that uses sound waves to create images of your heart. It provides your doctor with information about the size and shape of your heart and how well your heart's chambers and valves are working. This procedure takes approximately one hour. There are no restrictions for this procedure. Please do NOT wear cologne, perfume, aftershave, or lotions (deodorant is allowed). Please arrive 15 minutes prior to your appointment time.  Please note: We ask at that you not bring children with you during ultrasound (echo/ vascular) testing. Due to room size and safety concerns, children are not allowed in the ultrasound rooms during exams. Our front office staff cannot provide observation of children in our lobby area while testing is being conducted. An adult accompanying a patient to their appointment will only be allowed in the ultrasound room at the discretion of the ultrasound technician under special circumstances. We apologize for any inconvenience.   Follow-Up: As needed

## 2024-04-17 ENCOUNTER — Ambulatory Visit (HOSPITAL_COMMUNITY)

## 2024-05-11 ENCOUNTER — Other Ambulatory Visit: Payer: Self-pay | Admitting: Internal Medicine

## 2024-05-11 DIAGNOSIS — B20 Human immunodeficiency virus [HIV] disease: Secondary | ICD-10-CM

## 2024-05-21 ENCOUNTER — Ambulatory Visit: Admitting: Internal Medicine

## 2024-05-22 ENCOUNTER — Ambulatory Visit: Payer: Self-pay | Admitting: Internal Medicine

## 2024-05-22 ENCOUNTER — Ambulatory Visit (HOSPITAL_COMMUNITY)
Admission: RE | Admit: 2024-05-22 | Discharge: 2024-05-22 | Disposition: A | Discharge status: Home / Self Care | Source: Ambulatory Visit | Attending: Internal Medicine | Admitting: Internal Medicine

## 2024-05-22 DIAGNOSIS — R9431 Abnormal electrocardiogram [ECG] [EKG]: Secondary | ICD-10-CM | POA: Diagnosis not present

## 2024-05-22 DIAGNOSIS — R931 Abnormal findings on diagnostic imaging of heart and coronary circulation: Secondary | ICD-10-CM | POA: Insufficient documentation

## 2024-05-22 LAB — ECHOCARDIOGRAM COMPLETE
Area-P 1/2: 3.33 cm2
P 1/2 time: 693 ms
S' Lateral: 3.4 cm

## 2024-06-18 ENCOUNTER — Telehealth: Payer: Self-pay

## 2024-06-18 DIAGNOSIS — B20 Human immunodeficiency virus [HIV] disease: Secondary | ICD-10-CM

## 2024-06-18 MED ORDER — BIKTARVY 50-200-25 MG PO TABS: ORAL_TABLET | ORAL | 0 refills | Status: DC

## 2024-06-18 NOTE — Telephone Encounter (Signed)
 Received call from [facility] stating Biktarvy  was shipped to his home, but patient did not receive it. [facility] sent another shipment, but patient did not receive this one either.   Patient will be filing a police report, but is out of medication. [facility] and [facility] will [facility] a local [facility] for patient to pick up there.   Spoke with patient, he will come in tomorrow for samples. Notified him that office closes at 12:30. New Rx sent and follow up appointment scheduled.   Billy [name], BSN, RN

## 2024-06-19 ENCOUNTER — Other Ambulatory Visit: Payer: Self-pay

## 2024-06-19 ENCOUNTER — Other Ambulatory Visit

## 2024-06-19 DIAGNOSIS — B20 Human immunodeficiency virus [HIV] disease: Secondary | ICD-10-CM

## 2024-06-21 LAB — HIV-1 RNA QUANT-NO REFLEX-BLD
HIV 1 RNA Quant: NOT DETECTED {copies}/mL
HIV-1 RNA Quant, Log: NOT DETECTED {Log_copies}/mL

## 2024-06-21 LAB — COMPLETE METABOLIC PANEL WITHOUT GFR
AG Ratio: 1.8 (calc) (ref 1.0–2.5)
ALT: 14 U/L (ref 9–46)
AST: 18 U/L (ref 10–35)
Albumin: 4.5 g/dL (ref 3.6–5.1)
Alkaline phosphatase (APISO): 54 U/L (ref 35–144)
BUN/Creatinine Ratio: 8 (calc) (ref 6–22)
BUN: 11 mg/dL (ref 7–25)
CO2: 30 mmol/L (ref 20–32)
Calcium: 10.5 mg/dL — ABNORMAL HIGH (ref 8.6–10.3)
Chloride: 104 mmol/L (ref 98–110)
Creat: 1.41 mg/dL — ABNORMAL HIGH (ref 0.70–1.35)
Globulin: 2.5 g/dL (ref 1.9–3.7)
Glucose, Bld: 82 mg/dL (ref 65–99)
Potassium: 4.4 mmol/L (ref 3.5–5.3)
Sodium: 139 mmol/L (ref 135–146)
Total Bilirubin: 0.6 mg/dL (ref 0.2–1.2)
Total Protein: 7 g/dL (ref 6.1–8.1)

## 2024-06-21 LAB — CBC WITH DIFFERENTIAL/PLATELET
Absolute Lymphocytes: 2174 {cells}/uL (ref 850–3900)
Absolute Monocytes: 302 {cells}/uL (ref 200–950)
Basophils Absolute: 18 {cells}/uL (ref 0–200)
Basophils Relative: 0.4 %
Eosinophils Absolute: 90 {cells}/uL (ref 15–500)
Eosinophils Relative: 2 %
HCT: 45.9 % (ref 38.5–50.0)
Hemoglobin: 15.3 g/dL (ref 13.2–17.1)
MCH: 30.4 pg (ref 27.0–33.0)
MCHC: 33.3 g/dL (ref 32.0–36.0)
MCV: 91.1 fL (ref 80.0–100.0)
MPV: 10.8 fL (ref 7.5–12.5)
Monocytes Relative: 6.7 %
Neutro Abs: 1917 {cells}/uL (ref 1500–7800)
Neutrophils Relative %: 42.6 %
Platelets: 214 Thousand/uL (ref 140–400)
RBC: 5.04 Million/uL (ref 4.20–5.80)
RDW: 14 % (ref 11.0–15.0)
Total Lymphocyte: 48.3 %
WBC: 4.5 Thousand/uL (ref 3.8–10.8)

## 2024-06-21 LAB — T-HELPER CELLS (CD4) COUNT (NOT AT ARMC)
Absolute CD4: 627 {cells}/uL (ref 490–1740)
CD4 T Helper %: 30 % (ref 30–61)
Total lymphocyte count: 2104 {cells}/uL (ref 850–3900)

## 2024-06-25 ENCOUNTER — Other Ambulatory Visit: Payer: Self-pay | Admitting: Pharmacist

## 2024-06-25 MED ORDER — BICTEGRAVIR-EMTRICITAB-TENOFOV 50-200-25 MG PO TABS: 1.0000 | ORAL_TABLET | Freq: Every day | ORAL | Status: AC

## 2024-06-25 NOTE — Progress Notes (Signed)
 Medication Samples have been provided to the patient.  Drug name: Biktarvy         Strength: 50/200/25 mg       Qty: 14 tablets (2 bottles) LOT: [id number]   Exp.Date: 1/28  Samples requested by Leslee Code.  Dosing instructions: Take one tablet by mouth once daily  The patient has been instructed regarding the correct time, dose, and frequency of taking this medication, including desired effects and most common side effects.   Alan Geralds, PharmD, CPP, BCIDP, AAHIVP Clinical Pharmacist Practitioner Infectious Diseases Clinical Pharmacist [facility]

## 2024-07-07 ENCOUNTER — Ambulatory Visit (INDEPENDENT_AMBULATORY_CARE_PROVIDER_SITE_OTHER): Admitting: Internal Medicine

## 2024-07-07 ENCOUNTER — Other Ambulatory Visit (HOSPITAL_COMMUNITY)
Admission: RE | Admit: 2024-07-07 | Discharge: 2024-07-07 | Disposition: A | Discharge status: Home / Self Care | Source: Ambulatory Visit | Attending: Internal Medicine | Admitting: Internal Medicine

## 2024-07-07 ENCOUNTER — Other Ambulatory Visit: Payer: Self-pay

## 2024-07-07 DIAGNOSIS — B20 Human immunodeficiency virus [HIV] disease: Secondary | ICD-10-CM | POA: Diagnosis present

## 2024-07-07 MED ORDER — BIKTARVY 50-200-25 MG PO TABS: ORAL_TABLET | ORAL | 3 refills | Status: DC

## 2024-07-07 MED ORDER — ATORVASTATIN CALCIUM 10 MG PO TABS: 10.0000 mg | ORAL_TABLET | Freq: Every day | ORAL | 3 refills | Status: DC

## 2024-07-07 NOTE — Patient Instructions (Signed)
 Lab visit one week prior to next visit

## 2024-07-07 NOTE — Progress Notes (Signed)
 [facility]     HPI: Billy [name] is a 63 y.o. male male presents for HIV management on biktarvy . Last seen with Dr. Hartford in 2023. PT did not follow->going through prostate surgery due to elevated PsA. Pt states he only missed two missed doses.   Today: notes missed a week of biktarvy  3 weeks ago. No missed doses since then     Date of diagnosis: 20 years ago ART exposure: Combivir , Viramun -gevoyra->biktarvy  Past Ois: none Risk factors:  [employer]rose[employer] in last 2months 1, in the last 12 months 1.  Married     Social: Occupation: lexicographer Housing: house Support: wife Understanding of HIV: Etoh/drug/tobacco use: socail/no/no  Past Medical History:  Diagnosis Date   Headache    few times a month   HIV (human immunodeficiency virus infection) (HCC)    HX, PNEUMONIA 10/03/2006   Annotation: RLL  Qualifier: Diagnosis of   By: Elaine MD, John          Past Surgical History:  Procedure Laterality Date   EYE SURGERY Right    cosmetic eye   XI ROBOTIC ASSISTED SIMPLE PROSTATECTOMY N/A 10/25/2023   Procedure: PROSTATECTOMY, SIMPLE, ROBOT-ASSISTED;  Surgeon: Alvaro Ricardo KATHEE Mickey., MD;  Location: [facility];  Service: Urology;  Laterality: N/A;  180 MINUTES NEEDED FOR CASE    Family History  Problem Relation Age of Onset   Prostate cancer Father    Current Outpatient Medications on File Prior to Visit  Medication Sig Dispense Refill   bictegravir-emtricitabine -tenofovir  AF (BIKTARVY ) 50-200-25 MG TABS tablet TAKE 1 TABLET BY MOUTH 1 TIME A DAY 90 tablet 0   ELIQUIS  5 MG TABS tablet Take 5 mg by mouth 2 (two) times daily.     No current facility-administered medications on file prior to visit.    No Known Allergies    Lab Results HIV 1 RNA Quant  Date Value  06/19/2024 NOT DETECTED copies/mL  11/11/2023 NOT DETECTED copies/mL  03/22/2022 Not Detected Copies/mL   CD4 T Cell Abs (/uL)  Date Value  11/19/2023  593  03/22/2022 564  01/18/2020 618   No results found for: HIV1GENOSEQ Lab Results  Component Value Date   WBC 4.5 06/19/2024   HGB 15.3 06/19/2024   HCT 45.9 06/19/2024   MCV 91.1 06/19/2024   PLT 214 06/19/2024    Lab Results  Component Value Date   CREATININE 1.41 (H) 06/19/2024   BUN 11 06/19/2024   NA 139 06/19/2024   K 4.4 06/19/2024   CL 104 06/19/2024   CO2 30 06/19/2024   Lab Results  Component Value Date   ALT 14 06/19/2024   AST 18 06/19/2024   ALKPHOS 75 11/19/2023   BILITOT 0.6 06/19/2024    Lab Results  Component Value Date   CHOL 168 11/11/2023   TRIG 151 (H) 11/11/2023   HDL 37 (L) 11/11/2023   LDLCALC 105 (H) 11/11/2023   No results found for: HAV Lab Results  Component Value Date   HEPBSAG No 10/07/2006   HEPBSAB Yes 10/07/2006   Lab Results  Component Value Date   HCVAB No 10/07/2006   Lab Results  Component Value Date   CHLAMYDIAWP Negative 11/19/2023   N Negative 11/19/2023   No results found for: GCPROBEAPT No results found for: QUANTGOLD  Assessment/Plan #HIV/Asymptomatic CD4 600, VL ND on 11/25 -Continue biktarvy  -Will get labs and maintenance  today  -f/u in 6 month #Leg lesion Right leg  for about 2 weeks. It is painful 6/10. Possible cellulitis.  -doxy x 10 days    #STI testing -sexually acitve with wife Gc/ rpr    # PE on eliquis    #Vaccination COVID-no Flu 2025, needs Monkeypox PCV-20 10/26/23 Meningitis HepA-immune HEpB-ommune Tdap->he thinks he reived after prostate surgery Shingles   #Health maintenance -Quantiferon negative 11/15/23 -RPR nr 11/15/23 -HCV negative  11/15/23 -GC urine negative 11/15/23 -Lipid  11/15/23 The 10-year ASCVD risk score (Arnett DK, et al., 2019) is: 11.4%   Values used to calculate the score:     Age: 3 years     Clincally relevant sex: Male     Is Non-Hispanic African American: Yes     Diabetic: No     Tobacco smoker: No     Systolic Blood Pressure: 147 mmHg     Is  BP treated: No     HDL Cholesterol: 42 mg/dL     Total Cholesterol: 201 mg/dL Atorvastin today 10 mg-cmp in amonth -Colonoscopy->referred to GI    Loney Stank, MD [facility] I personally spent a total of 41 minutes in the care of the patient today including preparing to see the patient, getting/reviewing separately obtained history, performing a medically appropriate exam/evaluation, counseling and educating, placing orders, documenting clinical information in the EHR, independently interpreting results, and communicating results.

## 2024-07-08 LAB — URINE CYTOLOGY ANCILLARY ONLY
Chlamydia: NEGATIVE
Comment: NEGATIVE
Comment: NORMAL
Neisseria Gonorrhea: NEGATIVE

## 2024-07-08 LAB — CYTOLOGY, (ORAL, ANAL, URETHRAL) ANCILLARY ONLY
Chlamydia: NEGATIVE
Comment: NEGATIVE
Comment: NORMAL
Neisseria Gonorrhea: NEGATIVE

## 2024-07-08 LAB — SYPHILIS: RPR W/REFLEX TO RPR TITER AND TREPONEMAL ANTIBODIES, TRADITIONAL SCREENING AND DIAGNOSIS ALGORITHM: RPR Ser Ql: NONREACTIVE

## 2024-07-12 ENCOUNTER — Ambulatory Visit: Payer: Self-pay | Admitting: Internal Medicine

## 2024-07-15 ENCOUNTER — Other Ambulatory Visit: Payer: Self-pay

## 2024-07-15 DIAGNOSIS — B20 Human immunodeficiency virus [HIV] disease: Secondary | ICD-10-CM

## 2024-07-15 MED ORDER — ATORVASTATIN CALCIUM 10 MG PO TABS: 10.0000 mg | ORAL_TABLET | Freq: Every day | ORAL | 1 refills | Status: AC

## 2024-07-15 MED ORDER — BIKTARVY 50-200-25 MG PO TABS: ORAL_TABLET | ORAL | 1 refills | Status: AC

## 2024-09-17 ENCOUNTER — Telehealth (HOSPITAL_BASED_OUTPATIENT_CLINIC_OR_DEPARTMENT_OTHER): Payer: Self-pay

## 2024-09-17 NOTE — Telephone Encounter (Signed)
"  ° °  Pre-operative Risk Assessment    Patient Name: Lupe Bonner  DOB: May 04, 1961 MRN: [id number]   Date of last office visit: 03/10/24 with Dede Date of next office visit: NA  Request for Surgical Clearance    Procedure:  Colonoscopy   Date of Surgery:  Clearance    TBD                              Surgeon:  Dr. Murriel Socks Group or Practice Name:  [facility] [place]  Phone number:  [phone] Fax number:  [phone]   Type of Clearance Requested:   - Medical  - Pharmacy:  Hold Apixaban  (Eliquis ) for 2 days    Type of Anesthesia:  Not Indicated   Additional requests/questions:    Bonney Augustin JONETTA Delores   09/17/2024, 10:57 AM   "

## 2025-01-07 ENCOUNTER — Ambulatory Visit: Admitting: Internal Medicine
